# Patient Record
Sex: Female | Born: 1985 | Race: White | Hispanic: No | Marital: Single | Smoking: Current every day smoker
Health system: Southern US, Community
[De-identification: ages and names within clinical notes are randomized; demographics above are authoritative.]

## PROBLEM LIST (undated history)

## (undated) DIAGNOSIS — O039 Complete or unspecified spontaneous abortion without complication: Secondary | ICD-10-CM

## (undated) DIAGNOSIS — C801 Malignant (primary) neoplasm, unspecified: Secondary | ICD-10-CM

## (undated) DIAGNOSIS — M419 Scoliosis, unspecified: Secondary | ICD-10-CM

## (undated) HISTORY — PX: TONSILLECTOMY: SUR1361

## (undated) HISTORY — PX: DILATION AND CURETTAGE OF UTERUS: SHX78

---

## 2005-08-12 ENCOUNTER — Emergency Department: Payer: Self-pay | Admitting: Emergency Medicine

## 2006-08-05 ENCOUNTER — Emergency Department: Payer: Self-pay | Admitting: Emergency Medicine

## 2006-08-19 ENCOUNTER — Observation Stay: Payer: Self-pay | Admitting: Vascular Surgery

## 2006-10-21 ENCOUNTER — Emergency Department: Payer: Self-pay | Admitting: Unknown Physician Specialty

## 2007-11-04 ENCOUNTER — Other Ambulatory Visit: Payer: Self-pay

## 2007-11-04 ENCOUNTER — Emergency Department: Payer: Self-pay | Admitting: Emergency Medicine

## 2008-04-24 ENCOUNTER — Emergency Department: Payer: Self-pay | Admitting: Emergency Medicine

## 2008-05-10 ENCOUNTER — Emergency Department: Payer: Self-pay | Admitting: Emergency Medicine

## 2009-07-22 ENCOUNTER — Emergency Department: Payer: Self-pay | Admitting: Unknown Physician Specialty

## 2009-12-09 ENCOUNTER — Emergency Department: Payer: Self-pay | Admitting: Emergency Medicine

## 2010-02-18 ENCOUNTER — Emergency Department: Payer: Self-pay

## 2010-08-15 ENCOUNTER — Observation Stay: Payer: Self-pay

## 2010-09-05 ENCOUNTER — Inpatient Hospital Stay: Payer: Self-pay | Admitting: Psychiatry

## 2010-09-11 ENCOUNTER — Observation Stay: Payer: Self-pay | Admitting: Obstetrics and Gynecology

## 2010-09-11 ENCOUNTER — Inpatient Hospital Stay: Payer: Self-pay | Admitting: Psychiatry

## 2010-10-11 ENCOUNTER — Inpatient Hospital Stay: Payer: Self-pay | Admitting: Obstetrics and Gynecology

## 2010-10-29 ENCOUNTER — Observation Stay: Payer: Self-pay | Admitting: Obstetrics & Gynecology

## 2010-11-06 ENCOUNTER — Observation Stay: Payer: Self-pay | Admitting: Obstetrics and Gynecology

## 2010-11-08 ENCOUNTER — Encounter: Payer: Self-pay | Admitting: Obstetrics and Gynecology

## 2010-11-30 ENCOUNTER — Observation Stay: Payer: Self-pay | Admitting: Obstetrics & Gynecology

## 2010-12-06 ENCOUNTER — Encounter: Payer: Self-pay | Admitting: Obstetrics and Gynecology

## 2010-12-06 ENCOUNTER — Observation Stay: Payer: Self-pay | Admitting: Obstetrics and Gynecology

## 2010-12-07 ENCOUNTER — Observation Stay: Payer: Self-pay | Admitting: Obstetrics and Gynecology

## 2010-12-15 ENCOUNTER — Observation Stay: Payer: Self-pay | Admitting: Obstetrics and Gynecology

## 2010-12-20 ENCOUNTER — Encounter: Payer: Self-pay | Admitting: Obstetrics and Gynecology

## 2010-12-26 ENCOUNTER — Observation Stay: Payer: Self-pay | Admitting: Advanced Practice Midwife

## 2010-12-29 ENCOUNTER — Observation Stay: Payer: Self-pay

## 2011-01-07 ENCOUNTER — Observation Stay: Payer: Self-pay

## 2011-01-13 ENCOUNTER — Observation Stay: Payer: Self-pay | Admitting: Emergency Medicine

## 2011-01-15 ENCOUNTER — Inpatient Hospital Stay: Payer: Self-pay | Admitting: Obstetrics and Gynecology

## 2011-02-16 IMAGING — US US OB FOLLOW-UP
1 series · 17 of 28 positions shown · non-contrast
Comparison: none

REASON FOR EXAM: Polysubstance abuse, US needed for growth, and AFI
COMMENTS:

[Series 1: us ob follow-up · 17 of 40 slices shown]
[im 1/40]
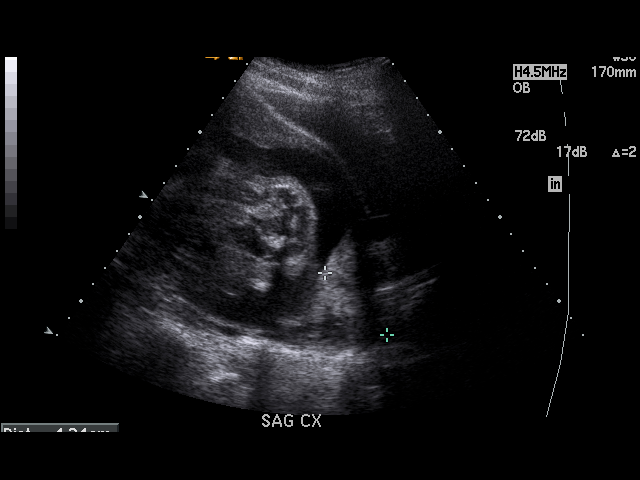
[im 3/40]
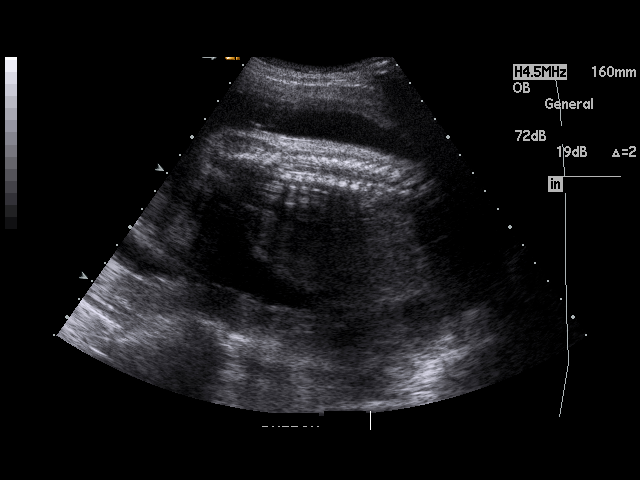
[im 6/40]
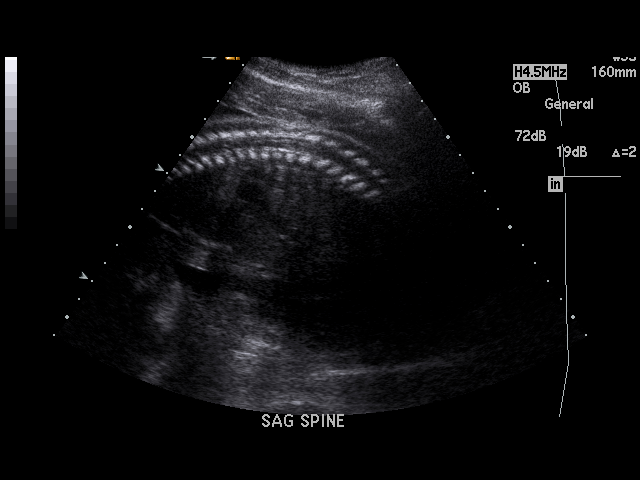
[im 8/40]
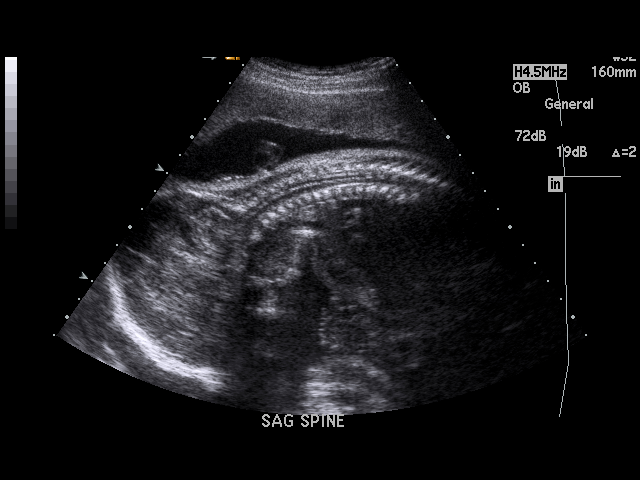
[im 11/40]
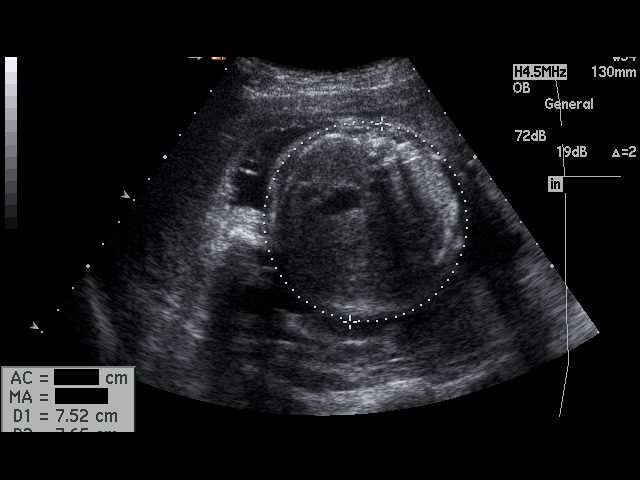
[im 14/40]
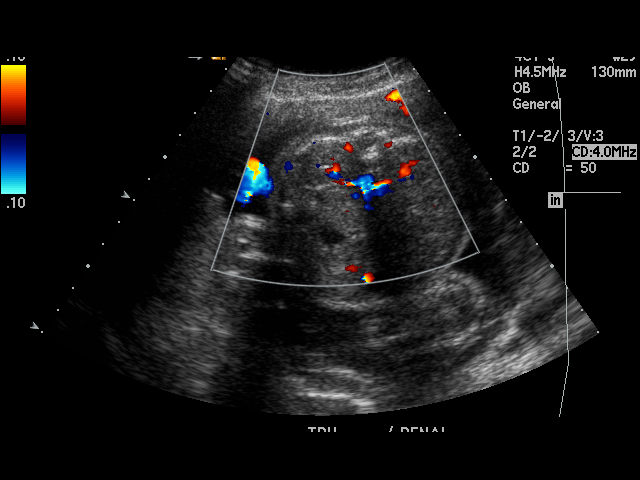
[im 15/40]
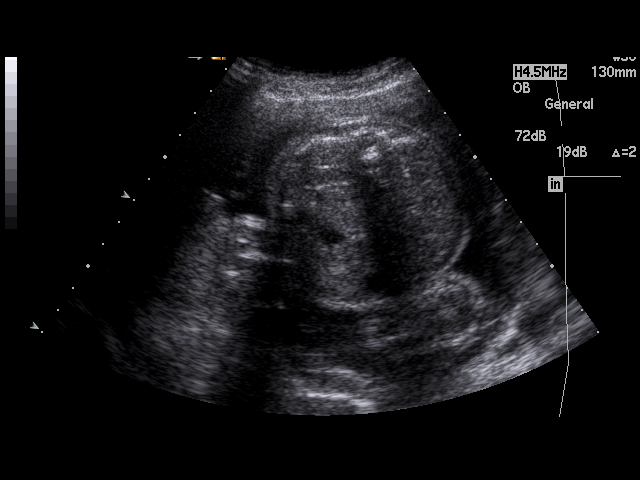
[im 18/40]
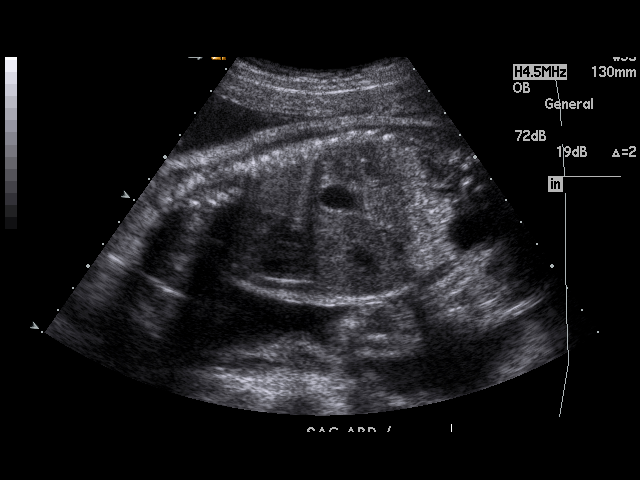
[im 21/40]
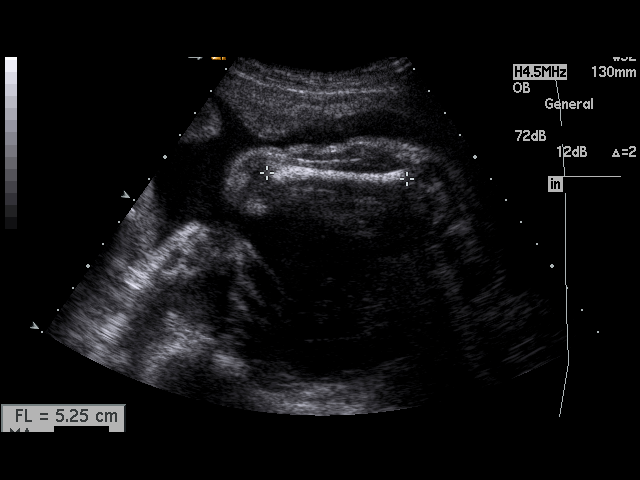
[im 22/40]
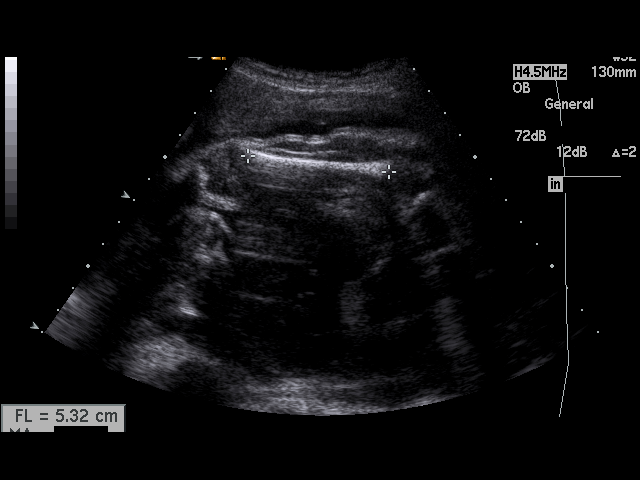
[im 25/40]
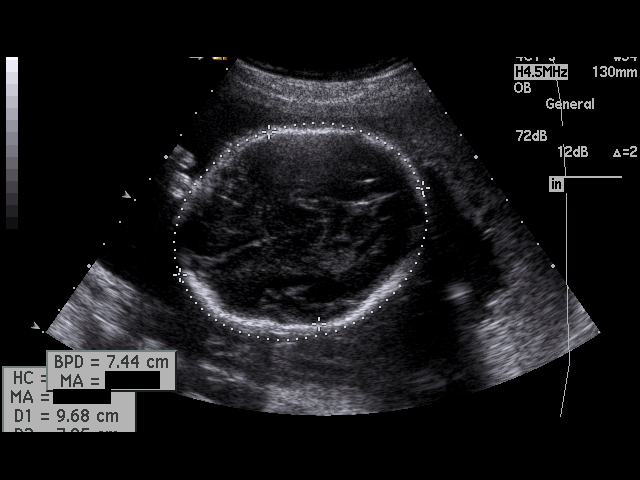
[im 27/40]
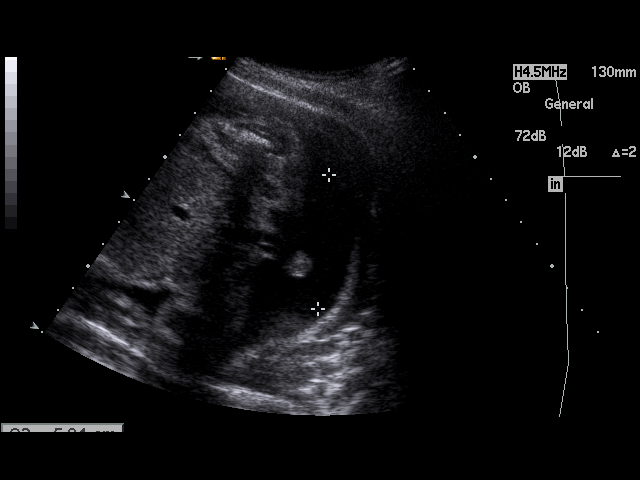
[im 29/40]
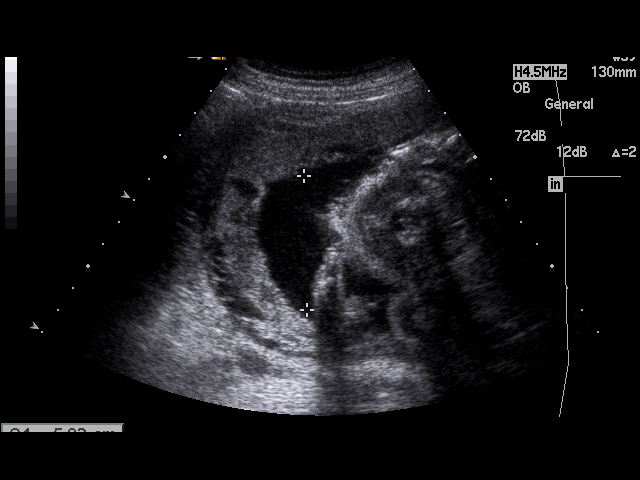
[im 32/40]
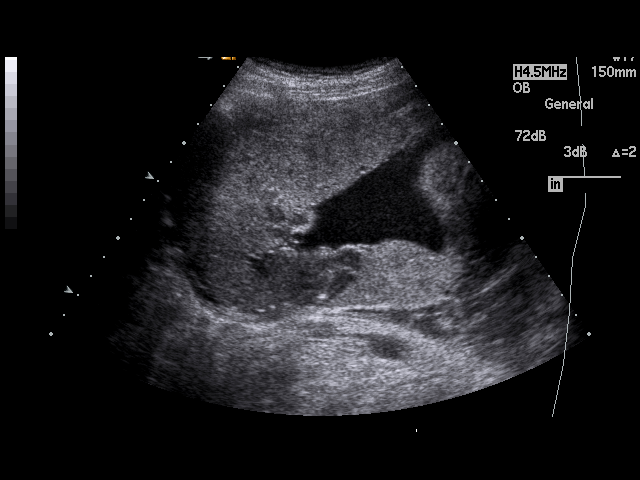
[im 34/40]
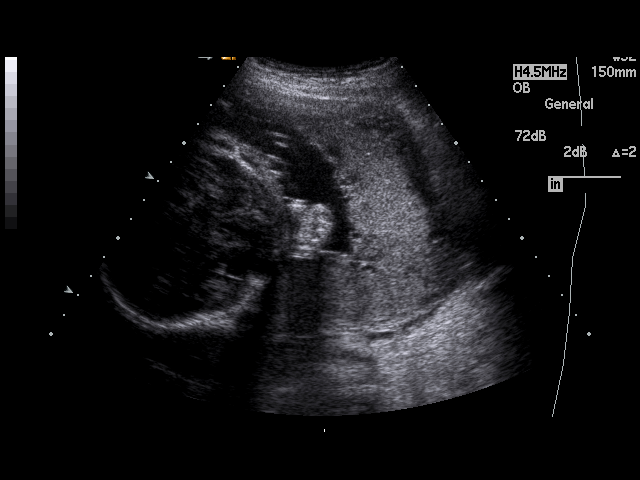
[im 37/40]
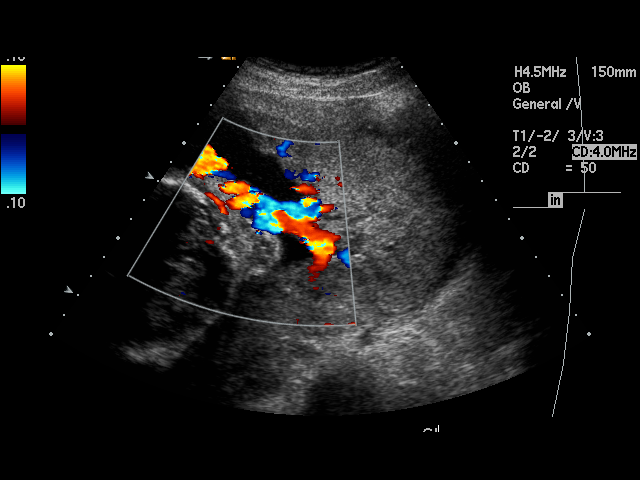
[im 40/40]
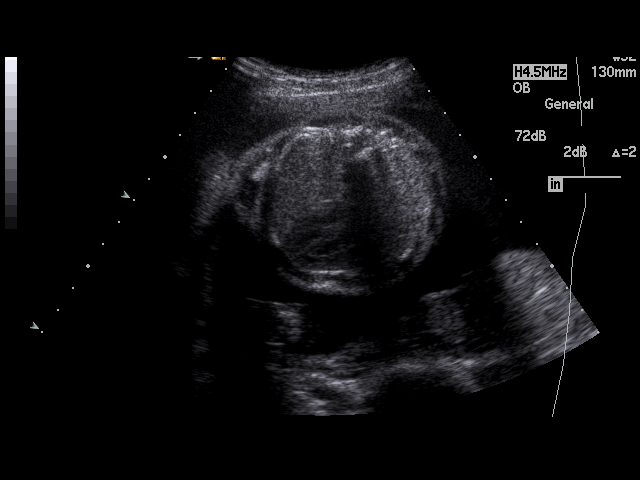

[17 of 28 positions shown; findings below may reference images not displayed]

PROCEDURE:     US  - US OB FOLLOW UP  - October 12, 2010  [DATE]

RESULT:     OB protocol pelvic sonogram demonstrates a single intrauterine
fetus. The cervical length is 4.24 cm. A breech presentation is
demonstrated. Fetal cardiac activity is demonstrated with a heart rate of
147 beats per minute. Extremity, trunk and diaphragmatic fetal motion was
demonstrated during the scan. The amniotic fluid volume is increased with a
measurement of 20.08 cm which is between the 50th and 95th percentiles. The
fetal bladder, kidneys, stomach, diaphragm and spine appear within normal
limits. The placenta is fundal and grade 2 without evidence of placenta
previa. Evaluation of the fetal heart and ventricles was performed on
previous examinations. The subcutaneous tissues appear to be somewhat
edematous on some of the images transversely through the abdomen. This
raises suspicion for possible mild edema. The gestational age based on the
fetal biometric profile is 29 weeks 0 days with an ultrasound estimate of
EDD of 12/28/2010. Current estimated fetal weight is 8414 grams + / - 165 grams.
IMPRESSION: Amniotic fluid volume is somewhat high. There may be some
fetal abdominal wall edema. High-Risk OB assessment is recommended. This was
recommended after the previous examination area

## 2011-04-03 ENCOUNTER — Emergency Department: Payer: Self-pay | Admitting: Unknown Physician Specialty

## 2011-04-27 ENCOUNTER — Emergency Department: Payer: Self-pay | Admitting: Emergency Medicine

## 2011-05-11 ENCOUNTER — Emergency Department: Payer: Self-pay | Admitting: Emergency Medicine

## 2011-12-08 ENCOUNTER — Emergency Department: Payer: Self-pay | Admitting: Internal Medicine

## 2012-01-16 ENCOUNTER — Emergency Department: Payer: Self-pay | Admitting: Emergency Medicine

## 2012-01-16 LAB — URINALYSIS, COMPLETE
Bilirubin,UR: NEGATIVE
Blood: NEGATIVE
Glucose,UR: NEGATIVE mg/dL (ref 0–75)
Leukocyte Esterase: NEGATIVE
Nitrite: NEGATIVE
Protein: NEGATIVE
RBC,UR: 1 /HPF (ref 0–5)
Squamous Epithelial: 4

## 2012-01-16 LAB — PREGNANCY, URINE: Pregnancy Test, Urine: NEGATIVE m[IU]/mL

## 2012-03-21 ENCOUNTER — Emergency Department: Payer: Self-pay | Admitting: *Deleted

## 2012-03-21 LAB — PREGNANCY, URINE: Pregnancy Test, Urine: POSITIVE m[IU]/mL

## 2012-03-21 LAB — URINALYSIS, COMPLETE
Bacteria: NONE SEEN
Bilirubin,UR: NEGATIVE
Glucose,UR: NEGATIVE mg/dL (ref 0–75)
Ketone: NEGATIVE
Nitrite: NEGATIVE
Ph: 7 (ref 4.5–8.0)
RBC,UR: <1 /HPF (ref 0–5)
Specific Gravity: 1.014 (ref 1.003–1.030)
WBC UR: 3 /HPF (ref 0–5)

## 2012-03-21 LAB — CBC
HCT: 39.2 % (ref 35.0–47.0)
HGB: 13.1 g/dL (ref 12.0–16.0)
MCH: 28.4 pg (ref 26.0–34.0)
MCHC: 33.3 g/dL (ref 32.0–36.0)
Platelet: 234 10*3/uL (ref 150–440)
RBC: 4.6 10*6/uL (ref 3.80–5.20)

## 2012-03-22 LAB — LIPASE, BLOOD: Lipase: 219 U/L (ref 73–393)

## 2012-03-22 LAB — HCG, QUANTITATIVE, PREGNANCY: Beta Hcg, Quant.: 45995 m[IU]/mL — ABNORMAL HIGH

## 2012-03-22 LAB — DIFFERENTIAL
Eosinophil #: 0.2 10*3/uL (ref 0.0–0.7)
Monocyte #: 1.3 x10 3/mm — ABNORMAL HIGH (ref 0.2–0.9)
Neutrophil #: 16.2 10*3/uL — ABNORMAL HIGH (ref 1.4–6.5)

## 2012-06-04 ENCOUNTER — Emergency Department: Payer: Self-pay | Admitting: Emergency Medicine

## 2012-07-20 ENCOUNTER — Emergency Department: Payer: Self-pay | Admitting: Emergency Medicine

## 2012-07-20 LAB — ETHANOL
Ethanol %: <0.003 % (ref 0.000–0.080)
Ethanol: <3 mg/dL

## 2012-07-20 LAB — URINALYSIS, COMPLETE
Bacteria: NONE SEEN
Bilirubin,UR: NEGATIVE
Glucose,UR: NEGATIVE mg/dL (ref 0–75)
Protein: NEGATIVE
RBC,UR: 1 /HPF (ref 0–5)
Specific Gravity: 1.019 (ref 1.003–1.030)
Squamous Epithelial: 2
WBC UR: 4 /HPF (ref 0–5)

## 2012-07-20 LAB — DRUG SCREEN, URINE
Amphetamines, Ur Screen: NEGATIVE (ref ?–1000)
Benzodiazepine, Ur Scrn: NEGATIVE (ref ?–200)
Cannabinoid 50 Ng, Ur ~~LOC~~: POSITIVE (ref ?–50)
Cocaine Metabolite,Ur ~~LOC~~: POSITIVE (ref ?–300)
MDMA (Ecstasy)Ur Screen: NEGATIVE (ref ?–500)
Opiate, Ur Screen: NEGATIVE (ref ?–300)

## 2012-07-20 LAB — COMPREHENSIVE METABOLIC PANEL
Albumin: 3 g/dL — ABNORMAL LOW (ref 3.4–5.0)
Alkaline Phosphatase: 67 U/L (ref 50–136)
BUN: 6 mg/dL — ABNORMAL LOW (ref 7–18)
Bilirubin,Total: 0.2 mg/dL (ref 0.2–1.0)
Co2: 26 mmol/L (ref 21–32)
Creatinine: 0.67 mg/dL (ref 0.60–1.30)
EGFR (Non-African Amer.): >60
Glucose: 90 mg/dL (ref 65–99)
Osmolality: 265 (ref 275–301)
Potassium: 3.6 mmol/L (ref 3.5–5.1)
SGOT(AST): 16 U/L (ref 15–37)
SGPT (ALT): 9 U/L — ABNORMAL LOW (ref 12–78)
Sodium: 134 mmol/L — ABNORMAL LOW (ref 136–145)
Total Protein: 7.1 g/dL (ref 6.4–8.2)

## 2012-07-20 LAB — ACETAMINOPHEN LEVEL: Acetaminophen: <2 ug/mL

## 2012-07-20 LAB — CBC
HCT: 36.8 % (ref 35.0–47.0)
HGB: 12.5 g/dL (ref 12.0–16.0)
MCH: 30.1 pg (ref 26.0–34.0)
MCHC: 34 g/dL (ref 32.0–36.0)
Platelet: 270 10*3/uL (ref 150–440)
RDW: 15.9 % — ABNORMAL HIGH (ref 11.5–14.5)

## 2013-06-23 ENCOUNTER — Inpatient Hospital Stay: Payer: Self-pay | Admitting: Psychiatry

## 2013-06-23 LAB — URINALYSIS, COMPLETE
Bilirubin,UR: NEGATIVE
Ketone: NEGATIVE
Nitrite: NEGATIVE
RBC,UR: 15 /HPF (ref 0–5)
Specific Gravity: 1.025 (ref 1.003–1.030)
WBC UR: 93 /HPF (ref 0–5)

## 2013-06-23 LAB — COMPREHENSIVE METABOLIC PANEL
Anion Gap: 6 — ABNORMAL LOW (ref 7–16)
BUN: 8 mg/dL (ref 7–18)
Calcium, Total: 9 mg/dL (ref 8.5–10.1)
EGFR (African American): >60
Sodium: 137 mmol/L (ref 136–145)

## 2013-06-23 LAB — CBC
HCT: 47.9 % — ABNORMAL HIGH (ref 35.0–47.0)
HGB: 15.9 g/dL (ref 12.0–16.0)
Platelet: 286 10*3/uL (ref 150–440)
RBC: 5.6 10*6/uL — ABNORMAL HIGH (ref 3.80–5.20)
RDW: 13.2 % (ref 11.5–14.5)
WBC: 4.9 10*3/uL (ref 3.6–11.0)

## 2013-06-23 LAB — DRUG SCREEN, URINE
Amphetamines, Ur Screen: NEGATIVE (ref ?–1000)
Barbiturates, Ur Screen: NEGATIVE (ref ?–200)
Cocaine Metabolite,Ur ~~LOC~~: POSITIVE (ref ?–300)
MDMA (Ecstasy)Ur Screen: NEGATIVE (ref ?–500)
Methadone, Ur Screen: NEGATIVE (ref ?–300)
Opiate, Ur Screen: NEGATIVE (ref ?–300)

## 2013-06-23 LAB — ETHANOL
Ethanol %: <0.003 % (ref 0.000–0.080)
Ethanol: <3 mg/dL

## 2013-07-16 ENCOUNTER — Emergency Department: Payer: Self-pay | Admitting: Emergency Medicine

## 2013-07-16 LAB — DRUG SCREEN, URINE
Amphetamines, Ur Screen: NEGATIVE (ref ?–1000)
Barbiturates, Ur Screen: NEGATIVE (ref ?–200)
Benzodiazepine, Ur Scrn: NEGATIVE (ref ?–200)
Cocaine Metabolite,Ur ~~LOC~~: POSITIVE (ref ?–300)
MDMA (Ecstasy)Ur Screen: POSITIVE (ref ?–500)
Opiate, Ur Screen: NEGATIVE (ref ?–300)
Phencyclidine (PCP) Ur S: NEGATIVE (ref ?–25)
Tricyclic, Ur Screen: NEGATIVE (ref ?–1000)

## 2013-07-16 LAB — ETHANOL: Ethanol %: <0.003 % (ref 0.000–0.080)

## 2013-07-16 LAB — PREGNANCY, URINE: Pregnancy Test, Urine: NEGATIVE m[IU]/mL

## 2015-03-17 NOTE — Consult Note (Signed)
PATIENT NAME:  Anna Ferguson, Anna Ferguson MR#:  [id number] DATE OF BIRTH:  05/14/1986  DATE OF CONSULTATION:  06/23/2013  REFERRING PHYSICIAN:   CONSULTING PHYSICIAN:  Idalia Needle. Chauncy Passy, MD  CHIEF COMPLAINT: "I'm going to have to have a detox plus I haven't been taking my medicines, so I need to be placed back on my medicines." When I asked her what she was taking, she says that she does not remember.    PSYCHIATRIC HISTORY: [name] reports that she has been in jail for 60 days for a probation violation and was discharged from jail on 04/22/2013. Since then, she has been using "Suboxone, crack and weed."   She also reports that she has a long history of depression and bipolar disorder. She has been on medicine for that and wants some medicines now.   She reports that she has been having poor appetite with weight loss, poor concentration, tearfulness, anhedonia and thoughts of suicide. She is a very tearful in the interview.   She reports losing custody of her daughter and not having any social support as 2 of her big problems. She has financial stress, legal issues and a history of abuse also. She denies any history of suicide attempts. "I just don't know how I'd do it, probably take an overdose."   She also denies homicidality. She reports she has no history of violence or incarceration for such. She does not wish to harm anyone.   ALCOHOL AND DRUG USE:  1. Cocaine. [name] reports that she first used cocaine at the age of 50, and she last used on 06/20/2013. She reports she uses 7 days a week and usually uses about 2 grams.  2. She also uses marijuana. She first used marijuana at the age of 77, and her last use of that was also 06/20/2013. She reports she uses 7 days a week and uses about 2 ounces a day.  3. She also uses Suboxone, and her age of first use with that is 27. She last used that also on 06/20/2013. She reports that she uses 2 strips IV 7 days a week.    MENTAL STATUS EXAM: [name] is disheveled and underweight. She is sad-appearing and has poor eye contact. She stares and is slow to react. She is unable to follow all questioning. She reports that she has been having difficulty falling and staying asleep with early morning awakening. She also has recent mood changes and anhedonia. She is dysphoric and depressed and anxious. She is sad and tearful on interview. She feels bad about her daughter and very guilty and hopeless. Her concentration is poor, and she tends perseverate. Her memory is intact. She denies any auditory or visual hallucinations or delusions. There is no psychosis. Her psychomotor behavior is within normal limits. She reports she has no energy. She is oriented x4. She is soft spoken on interview with minimal insight and poor judgment.   SOCIAL HISTORY: She reports that she is homeless and that she does not have an adequate personal support system. She reports that her 21-month-old daughter is living with her sister. Her family is not involved in her care.   MEDICATIONS: Omeprazole 20 mg oral delayed-release capsule p.o. daily. She reports she is taking no medicines and that she stopped them a long time ago.   ALLERGIES: No known drug allergies.   DIAGNOSIS, PRINCIPAL AND PRIMARY:  AXIS I: Polysubstance dependence. Bipolar disorder, not otherwise specified.  AXIS II: None. AXIS  III: Gastroesophageal reflux disease. AXIS IV: Moderate to severe.  AXIS V: 40%.   PLAN: [name] will be admitted to the [facility] when medically cleared.    ____________________________ Idalia Needle. Chauncy Passy, MD fcg:OSi D: 06/23/2013 12:50:00 ET T: 06/23/2013 13:06:49 ET JOB#: [id number]  cc: Idalia Needle. Chauncy Passy, MD, <Dictator> Ladoris Gene MD ELECTRONICALLY SIGNED 06/23/2013 13:50

## 2015-03-17 NOTE — Consult Note (Signed)
Brief Consult Note: Diagnosis: cocaine and alcohol dep.   Patient was seen by consultant.   Comments: Psychiatry: PAtient with hx of substance dependence presented to ER with somatic complaints. Affect calm. Denies any suicidal or homicidal ideation and is commited to remaining sober. Insight good. Has been accepted to [facility] which is a good disposition and exactly what she wants. Supportive and educational counceling done. No need for other emergency intervention. Suggest patient be released from ER to residential treatment.  Electronic Signatures: Gonzella Lex (MD)  (Signed 22-Aug-14 17:26)  Authored: Brief Consult Note   Last Updated: 22-Aug-14 17:26 by Gonzella Lex (MD)

## 2015-03-17 NOTE — H&P (Signed)
PATIENT NAME:  Anna Ferguson, Anna Ferguson MR#:  [id number] DATE OF BIRTH:  [birth year]  DATE OF ADMISSION:  06/23/2013  DATE OF BIRTH: 1986-04-26.   DATE OF ASSESSMENT: 06/24/2013.  REFERRING PHYSICIAN:  Emergency Room MD.  ATTENDING PHYSICIAN: Christiano Blandon B. Bary Leriche, M.D.   IDENTIFYING DATA: Ms Courtney Paris is a 29 year old female with history of mood instability and substance abuse.   CHIEF COMPLAINT: "I need detox."  HISTORY OF PRESENT ILLNESS: Ms Courtney Paris was just released from jail. She relapsed on substances immediately. She came to the hospital asking for detox.   She endorses some symptoms of depression with poor sleep, decreased appetite, anhedonia, feelings of guilt, worthlessness, hopelessness, poor energy and concentration, social isolation, crying spells. She feels awful about her relapse as she is already in the process of [facility] residential program. She now and needs to be detoxed from substances before she can in the [facility], and the [facility] is meant for women with substance abuse problems who also are trying to get out of legal trouble or regain custody of their children.   Indeed, the patient has an 82-month-old daughter who is in the custody of her sister. The patient denies psychotic symptoms. She denies symptoms suggestive of bipolar mania, but does endorse high anxiety and mood instability. She denies heavy alcohol use, but has been using cocaine, marijuana, and Suboxone. She uses 2 films of Suboxone IV daily.   PAST PSYCHIATRIC HISTORY: She has been hospitalized since adolescence at least 3 three hospitalizations then, at [facility] and at [facility]. She has been tried on several medications including antidepressants, which she does not believe work for her, Zyprexa and Haldol. She remembers fondly Klonopin that helps with her anxiety. There is a suspicion of mental retardation, but the patient has  never been formally tested.   FAMILY PSYCHIATRIC HISTORY: Mother and father with anxiety, depression, and also an aunt with the same problems.   PAST MEDICAL HISTORY: History of pneumothorax.   ALLERGIES: No known drug allergies.   MEDICATIONS ON ADMISSION: None.   SOCIAL HISTORY: She was just released from jail. She has 2 children. One was given up for adoption. Her 58-month-old daughter is in the care of a sister. The patient is unemployed. She has no income, no health benefits. And she dropped out of school in the 10th grade, most likely due to learning difficulties. She has been to jail numerous times. Apparently there are no legal charges pending at the moment.   REVIEW OF SYSTEMS:  CONSTITUTIONAL: No fevers or chills. No weight changes.  EYES: No double or blurred vision.  ENT: No hearing loss.  RESPIRATORY: No shortness of breath or cough.  CARDIOVASCULAR: No chest pain or orthopnea.  GASTROINTESTINAL: No abdominal pain, nausea, vomiting, or diarrhea.  GENITOURINARY: No incontinence or frequency.  ENDOCRINE: No heat or cold intolerance.  LYMPHATIC: No anemia or easy bruising.  INTEGUMENTARY: No acne or rash.  MUSCULOSKELETAL: No muscle or joint pain.  NEUROLOGIC: No tingling or weakness.  PSYCHIATRIC: See history of present illness for details.   PHYSICAL EXAMINATION: VITAL SIGNS: Blood pressure 101/70, pulse 56, respirations 18, temperature 97.3.  GENERAL: This is a slender female in no acute distress.  HEENT: The pupils are equal, round, and reactive to light. Sclerae are anicteric.  NECK: Supple. No thyromegaly.   LUNGS: Clear to auscultation. No dullness to percussion.  HEART: Regular rhythm and rate. No murmurs, rubs, or gallops.  ABDOMEN: Soft, nontender, nondistended. Positive bowel  sounds.  MUSCULOSKELETAL: Normal muscle strength in all extremities.  SKIN: No rashes or bruises.  LYMPHATIC: No cervical adenopathy.  NEUROLOGIC: Cranial nerves II-XII are intact.    LABORATORY DATA: Chemistries are within normal limits. Blood alcohol level is 0. LFTs within normal limits. TSH 2.88.   Urine tox screen positive for cocaine and cannabinoids.   CBC with normal limits. Urinalysis is suggestive of urinary tract infection.   MENTAL STATUS EXAMINATION ON ADMISSION: The patient is asleep, but easily arousable. She is oriented to person, place, time, and situation. She is pleasant, polite, and cooperative. She is marginally groomed. She is wearing hospital scrubs. She maintains some eye contact. Her speech is soft. Mood is depressed, with flat affect. Thought processing is logical and goal-oriented. Thought content: She denies suicidal or homicidal ideations. There are no delusions or paranoia. There are no auditory or visual hallucinations. Her cognition is grossly intact. She registers 3 out of 3 and recalls 3 out of 3 objects after 5 minutes. She can spell "world" forwards and backwards. Her thinking is concrete, and she is not able to interpret proverbs. Her insight and judgment are very limited.   Suicide risk assessment on admission: This is a patient with a history of substance abuse and mood instability, who came to the hospital asking for detox.   DIAGNOSES: AXIS I:  1.  Polysubstance dependence.  2.  Mood disorder, not otherwise specified.  3.  History of diagnosis of bipolar disorder.   AXIS II: Cognitive disorder, not otherwise specified.   AXIS III: Deferred.   AXIS IV: Mental illness, substance abuse, housing, financial, primary support, family conflict.   AXIS V: Global Assessment of Functioning on admission: 35.   PLAN: The patient was admitted to [facility] behavioral medicine unit for safety, stabilization and medication management. She was initially placed on suicide precautions and was closely monitored for any unsafe behaviors. She underwent full psychiatric and risk assessment. She received pharmacotherapy,  individual and group psychotherapy, substance abuse counseling, and support from therapeutic milieu.   1.  Substance abuse: The patient is placed on opioid detox protocol with symptomatic treatment.  2.  Social:  We will get in touch with [facility] to schedule a followup.  3.  Disposition: To be established. The patient has no place to go, and it is unclear if a bed will be open at [facility].    ____________________________ Wardell Honour Bary Leriche, MD jbp:dm D: 06/24/2013 13:48:32 ET T: 06/24/2013 14:23:01 ET JOB#: [id number]  cc: Traves Majchrzak B. Bary Leriche, MD, <Dictator> Clovis Fredrickson MD ELECTRONICALLY SIGNED 07/02/2013 6:36

## 2017-11-30 ENCOUNTER — Emergency Department: Payer: Medicaid Other

## 2017-11-30 ENCOUNTER — Encounter: Payer: Self-pay | Admitting: *Deleted

## 2017-11-30 ENCOUNTER — Emergency Department
Admission: EM | Admit: 2017-11-30 | Discharge: 2017-11-30 | Disposition: A | Discharge status: Home / Self Care | Payer: Medicaid Other | Attending: Emergency Medicine | Admitting: Emergency Medicine

## 2017-11-30 ENCOUNTER — Other Ambulatory Visit: Payer: Self-pay

## 2017-11-30 DIAGNOSIS — O4690 Antepartum hemorrhage, unspecified, unspecified trimester: Secondary | ICD-10-CM | POA: Diagnosis not present

## 2017-11-30 DIAGNOSIS — M545 Low back pain, unspecified: Secondary | ICD-10-CM

## 2017-11-30 DIAGNOSIS — Z8759 Personal history of other complications of pregnancy, childbirth and the puerperium: Secondary | ICD-10-CM | POA: Diagnosis not present

## 2017-11-30 DIAGNOSIS — Z3A12 12 weeks gestation of pregnancy: Secondary | ICD-10-CM | POA: Insufficient documentation

## 2017-11-30 DIAGNOSIS — F17200 Nicotine dependence, unspecified, uncomplicated: Secondary | ICD-10-CM | POA: Diagnosis not present

## 2017-11-30 DIAGNOSIS — O200 Threatened abortion: Secondary | ICD-10-CM

## 2017-11-30 DIAGNOSIS — N939 Abnormal uterine and vaginal bleeding, unspecified: Secondary | ICD-10-CM

## 2017-11-30 HISTORY — DX: Complete or unspecified spontaneous abortion without complication: O03.9

## 2017-11-30 LAB — CBC WITH DIFFERENTIAL/PLATELET
Basophils Absolute: 0.1 10*3/uL (ref 0–0.1)
Basophils Relative: 1 %
EOS ABS: 0.1 10*3/uL (ref 0–0.7)
EOS PCT: 2 %
HCT: 41 % (ref 35.0–47.0)
Hemoglobin: 13.4 g/dL (ref 12.0–16.0)
LYMPHS ABS: 1.4 10*3/uL (ref 1.0–3.6)
LYMPHS PCT: 24 %
MCH: 28.7 pg (ref 26.0–34.0)
MCHC: 32.8 g/dL (ref 32.0–36.0)
MCV: 87.6 fL (ref 80.0–100.0)
MONO ABS: 0.4 10*3/uL (ref 0.2–0.9)
Monocytes Relative: 8 %
Neutro Abs: 3.7 10*3/uL (ref 1.4–6.5)
Neutrophils Relative %: 65 %
PLATELETS: 242 10*3/uL (ref 150–440)
RBC: 4.68 MIL/uL (ref 3.80–5.20)
RDW: 15.1 % — AB (ref 11.5–14.5)
WBC: 5.7 10*3/uL (ref 3.6–11.0)

## 2017-11-30 LAB — BASIC METABOLIC PANEL
Anion gap: 11 (ref 5–15)
BUN: 5 mg/dL — AB (ref 6–20)
CALCIUM: 9.3 mg/dL (ref 8.9–10.3)
CO2: 21 mmol/L — ABNORMAL LOW (ref 22–32)
CREATININE: 0.64 mg/dL (ref 0.44–1.00)
Chloride: 105 mmol/L (ref 101–111)
GFR calc Af Amer: >60 mL/min (ref >60)
GLUCOSE: 88 mg/dL (ref 65–99)
POTASSIUM: 4 mmol/L (ref 3.5–5.1)
SODIUM: 137 mmol/L (ref 135–145)

## 2017-11-30 LAB — PREGNANCY, URINE: PREG TEST UR: POSITIVE — AB

## 2017-11-30 LAB — HCG, QUANTITATIVE, PREGNANCY: hCG, Beta Chain, Quant, S: 14833 m[IU]/mL — ABNORMAL HIGH (ref <5)

## 2017-11-30 LAB — ABO/RH: ABO/RH(D): A POS

## 2017-11-30 NOTE — ED Provider Notes (Signed)
[facility] Emergency Department Provider Note   ____________________________________________    I have reviewed the triage vital signs and the nursing notes.   HISTORY  Chief Complaint Vaginal Bleeding     HPI Anna Ferguson is a 32 y.o. female who reports she is approximately [redacted] weeks pregnant who presents with complaints of vaginal bleeding.  Patient started off as spotting but has worsened overnight.  She denies abdominal pain.  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  Denies fevers or chills.  N she does have some cramping bilateral lower back pain.  No nausea or vomiting.  Has not taken anything for this   Past Medical History:  Diagnosis Date  . Miscarriage     There are no active problems to display for this patient.   Past Surgical History:  Procedure Laterality Date  . DILATION AND CURETTAGE OF UTERUS      Prior to Admission medications   Not on File     Allergies Gabapentin  No family history on file.  Social History Social History   Tobacco Use  . Smoking status: Current Every Day Smoker  . Smokeless tobacco: Never Used  Substance Use Topics  . Alcohol use: No    Frequency: Never  . Drug use: No    Review of Systems  Constitutional: No fever/chills Eyes: No visual changes.  ENT: No sore throat. Cardiovascular: Denies chest pain. Respiratory: Denies shortness of breath. Gastrointestinal: No abdominal pain.  No nausea, no vomiting.   Genitourinary: As above Musculoskeletal: As above Skin: Negative for rash. Neurological: Negative for headaches    ____________________________________________   PHYSICAL EXAM:  VITAL SIGNS: ED Triage Vitals  Enc Vitals Group     BP 11/30/17 1331 (!) 110/58     Pulse Rate 11/30/17 1331 87     Resp 11/30/17 1331 15     Temp 11/30/17 1331 97.8 F (36.6 C)     Temp Source 11/30/17 1331 Oral     SpO2 11/30/17 1331 100 %     Weight 11/30/17 1332 48.5 kg  (107 lb)     Height 11/30/17 1332 1.727 m (5\' 8" )     Head Circumference --      Peak Flow --      Pain Score 11/30/17 1344 6     Pain Loc --      Pain Edu? --      Excl. in [place]? --     Constitutional: Alert and oriented. No acute distress. . Nose: No congestion/rhinnorhea. Mouth/Throat: Mucous membranes are moist.   Neck:  Painless ROM Cardiovascular: Normal rate, regular rhythm. Kermit Balo peripheral circulation. Respiratory: Normal respiratory effort.  No retractions. Gastrointestinal: Soft and nontender. No distention.  No CVA tenderness. Genitourinary: deferred Musculoskeletal: No lower extremity tenderness nor edema.  Warm and well perfused.  Back: Normal exam, no vertebral tenderness, no muscle tenderness bruising or injury Neurologic:  Normal speech and language. No gross focal neurologic deficits are appreciated.  Skin:  Skin is warm, dry and intact. No rash noted. Psychiatric: Mood and affect are normal. Speech and behavior are normal.  ____________________________________________   LABS (all labs ordered are listed, but only abnormal results are displayed)  Labs Reviewed  HCG, QUANTITATIVE, PREGNANCY - Abnormal; Notable for the following components:      Result Value   hCG, Beta Chain, Quant, S 14,833 (*)    All other components within normal limits  CBC WITH DIFFERENTIAL/PLATELET -  Abnormal; Notable for the following components:   RDW 15.1 (*)    All other components within normal limits  BASIC METABOLIC PANEL - Abnormal; Notable for the following components:   CO2 21 (*)    BUN 5 (*)    All other components within normal limits  PREGNANCY, URINE - Abnormal; Notable for the following components:   Preg Test, Ur POSITIVE (*)    All other components within normal limits  ABO/RH   ____________________________________________  EKG  None ____________________________________________  RADIOLOGY  Ultrasound demonstrates 7-week  IUP ____________________________________________   PROCEDURES  Procedure(s) performed: No  Procedures   Critical Care performed:No ____________________________________________   INITIAL IMPRESSION / ASSESSMENT AND PLAN / ED COURSE  Pertinent labs & imaging results that were available during my care of the patient were reviewed by me and considered in my medical decision making (see chart for details).  Patient presents with vaginal bleeding of the first trimester.  History of miscarriage in the past.  Difficult stick, lab has managed to obtain labs.  Pending hCG, ABO Rh, ultrasound.  Vitals normal, exam reassuring.  Differential includes threatened miscarriage, ectopic pregnancy, subchorionic hemorrhage  ----------------------------------------- 3:57 PM on 11/30/2017 -----------------------------------------  Ultrasound shows 7-week IUP, a positive blood type, recommend follow-up with OB for hormone recheck    ____________________________________________   FINAL CLINICAL IMPRESSION(S) / ED DIAGNOSES  Final diagnoses:  Vaginal bleeding  Lumbar back pain        Note:  This document was prepared using Dragon voice recognition software and may include unintentional dictation errors.    Lavonia Drafts, MD 11/30/17 [phone]

## 2017-11-30 NOTE — ED Triage Notes (Addendum)
Patient states she is [redacted] weeks pregnant and started bleeding last night that began lightly and is now heavier. Patient c/o lumbar back pain. Patient has had two previous miscarriages.

## 2017-11-30 NOTE — ED Notes (Signed)
Phlebotomist at bedside.

## 2017-11-30 NOTE — ED Notes (Signed)
Phlebotomist called for lab draw due to multiple attempts in triage by sonja and dave with no success

## 2017-11-30 NOTE — ED Notes (Addendum)
Patient and significant other left at getting results from MD without d/c papers. No pain assessment, vitals and signature available to get due to patient leaving

## 2017-11-30 NOTE — ED Notes (Signed)
Patient called multiple times for CPOD room. Pt found outside smoking and taken to room

## 2017-12-03 ENCOUNTER — Ambulatory Visit (INDEPENDENT_AMBULATORY_CARE_PROVIDER_SITE_OTHER): Payer: Medicaid Other | Admitting: Obstetrics and Gynecology

## 2017-12-03 ENCOUNTER — Other Ambulatory Visit (INDEPENDENT_AMBULATORY_CARE_PROVIDER_SITE_OTHER): Payer: Medicaid Other

## 2017-12-03 ENCOUNTER — Encounter: Payer: Self-pay | Admitting: Obstetrics and Gynecology

## 2017-12-03 VITALS — BP 92/52 | HR 71 | Ht 68.0 in | Wt 111.0 lb

## 2017-12-03 DIAGNOSIS — O034 Incomplete spontaneous abortion without complication: Secondary | ICD-10-CM | POA: Diagnosis not present

## 2017-12-03 DIAGNOSIS — Z23 Encounter for immunization: Secondary | ICD-10-CM

## 2017-12-03 DIAGNOSIS — O021 Missed abortion: Secondary | ICD-10-CM | POA: Diagnosis not present

## 2017-12-03 NOTE — Progress Notes (Signed)
Pt's   Patient ID: Anna Ferguson, female   DOB: 11/07/1986, 32 y.o.   MRN: [id number]  Reason for Consult: Follow-up (ER f/u early OB bleeding)   Referred by No ref. provider found  Subjective:     HPI:  Anna Ferguson is a 32 y.o. female. She had positive urine pregnancy test at home and pregnancy was confirmed at [facility]. Pt went to ER 4 nights ago for heavy bleeding. Pain in lower back. The bleeding has continued. Similar bleeding to a period. She denies passage of tissue. Patient uses suboxone.  Past Medical History:  Diagnosis Date  . Miscarriage    Family History  Problem Relation Age of Onset  . Cancer Neg Hx   . Diabetes Neg Hx   . Stroke Neg Hx   . Heart disease Neg Hx    Past Surgical History:  Procedure Laterality Date  . DILATION AND CURETTAGE OF UTERUS      Short Social History:  Social History   Tobacco Use  . Smoking status: Current Every Day Smoker    Packs/day: 0.50    Types: Cigarettes  . Smokeless tobacco: Never Used  Substance Use Topics  . Alcohol use: No    Frequency: Never    Allergies  Allergen Reactions  . Gabapentin Other (See Comments)    seizures    Current Outpatient Medications  Medication Sig Dispense Refill  . Buprenorphine HCl-Naloxone HCl (SUBOXONE) 8-2 MG FILM Place under the tongue 2 (two) times daily.     No current facility-administered medications for this visit.     Review of Systems  Constitutional: Negative for chills, fatigue, fever and unexpected weight change.  HENT: Negative for trouble swallowing.  Eyes: Negative for loss of vision.  Respiratory: Negative for cough, shortness of breath and wheezing.  Cardiovascular: Negative for chest pain, leg swelling, palpitations and syncope.  GI: Negative for abdominal pain, blood in stool, diarrhea, nausea and vomiting.  GU: Negative for difficulty urinating, dysuria, frequency and hematuria.  Musculoskeletal: Positive for back pain. Negative for  leg pain and joint pain.  Skin: Negative for rash.  Neurological: Negative for dizziness, headaches, light-headedness, numbness and seizures.  Psychiatric: Negative for behavioral problem, confusion, depressed mood and sleep disturbance.        Objective:  Objective   Vitals:   12/03/17 1148  BP: (!) 92/52  Pulse: 71  Weight: 111 lb (50.3 kg)  Height: 5\' 8"  (1.727 m)   Body mass index is 16.88 kg/m.  Physical Exam  Constitutional: She is oriented to person, place, and time. She appears well-developed and well-nourished.  HENT:  Head: Normocephalic and atraumatic.  Eyes: EOM are normal.  Cardiovascular: Normal rate, regular rhythm and normal heart sounds.  Pulmonary/Chest: Effort normal and breath sounds normal.  Genitourinary:  Genitourinary Comments: Bright red vaginal bleeding. No products at cervical os. Os open.  Neurological: She is alert and oriented to person, place, and time.  Skin: Skin is warm and dry.  Psychiatric: She has a normal mood and affect. Her behavior is normal. Judgment and thought content normal.  Nursing note and vitals reviewed.  [place] from ER reviewed. Transvaginal US today in clinic did not show fetal heartbeat. This is consistent with a incomplete abortion     Assessment/Plan:     Incomplete abortion. Patient and partner were distressed. Offered medical or surgical management. Patient declined these options and plans for expectant management. Asked patient to follow up in 2 weeks, but she  left abruptly.  Flu vaccination performed today.  Homero Fellers MD [facility]  12/04/17 7:09 PM

## 2017-12-09 ENCOUNTER — Encounter: Payer: Self-pay | Admitting: Advanced Practice Midwife

## 2017-12-10 ENCOUNTER — Encounter: Payer: Self-pay | Admitting: Obstetrics and Gynecology

## 2017-12-11 ENCOUNTER — Other Ambulatory Visit: Payer: Self-pay | Admitting: Obstetrics and Gynecology

## 2017-12-11 ENCOUNTER — Ambulatory Visit: Payer: Medicaid Other | Admitting: Obstetrics and Gynecology

## 2017-12-11 ENCOUNTER — Ambulatory Visit (INDEPENDENT_AMBULATORY_CARE_PROVIDER_SITE_OTHER): Payer: Medicaid Other

## 2017-12-11 ENCOUNTER — Encounter: Payer: Self-pay | Admitting: Obstetrics and Gynecology

## 2017-12-11 VITALS — BP 87/56 | HR 91 | Ht 68.0 in | Wt 109.4 lb

## 2017-12-11 DIAGNOSIS — O021 Missed abortion: Secondary | ICD-10-CM

## 2017-12-11 DIAGNOSIS — O200 Threatened abortion: Secondary | ICD-10-CM

## 2017-12-11 NOTE — Progress Notes (Signed)
HPI:      [name] is a 32 y.o. [id number] who LMP was Patient's last menstrual period was 10/06/2017 (approximate).  Subjective:   She presents today after being seen in the emergency department for vaginal bleeding in pregnancy.  She also had a follow-up visit at [facility]  She underwent an ultrasound which revealed a fetal pole without heartbeat.  Patient was concerned regarding this and insists that she has felt fetal movement.  The bottom line is that the patient and her husband are not convinced of fetal demise. Patient states that she has had miscarriages before and this is not like that. Currently taking Flagyl for trichomonas. Denies bleeding today.    Hx: The following portions of the patient's history were reviewed and updated as appropriate:             She  has a past medical history of Miscarriage. She does not have a problem list on file. She  has a past surgical history that includes Dilation and curettage of uterus. Her family history is not on file. She  reports that she has been smoking cigarettes.  She has been smoking about 0.50 packs per day. she has never used smokeless tobacco. She reports that she does not drink alcohol or use drugs. She is allergic to gabapentin.       Review of Systems:  Review of Systems  Constitutional: Denied constitutional symptoms, night sweats, recent illness, fatigue, fever, insomnia and weight loss.  Eyes: Denied eye symptoms, eye pain, photophobia, vision change and visual disturbance.  Ears/Nose/Throat/Neck: Denied ear, nose, throat or neck symptoms, hearing loss, nasal discharge, sinus congestion and sore throat.  Cardiovascular: Denied cardiovascular symptoms, arrhythmia, chest pain/pressure, edema, exercise intolerance, orthopnea and palpitations.  Respiratory: Denied pulmonary symptoms, asthma, pleuritic pain, productive sputum, cough, dyspnea and wheezing.  Gastrointestinal: Denied, gastro-esophageal reflux,  melena, nausea and vomiting.  Genitourinary: Denied genitourinary symptoms including symptomatic vaginal discharge, pelvic relaxation issues, and urinary complaints.  Musculoskeletal: Denied musculoskeletal symptoms, stiffness, swelling, muscle weakness and myalgia.  Dermatologic: Denied dermatology symptoms, rash and scar.  Neurologic: Denied neurology symptoms, dizziness, headache, neck pain and syncope.  Psychiatric: Denied psychiatric symptoms, anxiety and depression.  Endocrine: Denied endocrine symptoms including hot flashes and night sweats.   Meds:   Current Outpatient Medications on File Prior to Visit  Medication Sig Dispense Refill  . Buprenorphine HCl-Naloxone HCl (SUBOXONE) 8-2 MG FILM Place under the tongue 2 (two) times daily.    . metroNIDAZOLE (FLAGYL) 500 MG tablet Take 500 mg by mouth.    . Prenatal Vit-Fe Fumarate-FA (PRENATAL MULTIVITAMIN) TABS tablet Take 1 tablet by mouth daily at 12 noon.     No current facility-administered medications on file prior to visit.     Objective:     Vitals:   12/11/17 1003  BP: (!) 87/56  Pulse: 91              Ultrasound results reviewed directly with the patient (these were from [facility])  Quantitative beta hCGs reviewed  Assessment:    [id number] There are no active problems to display for this patient.    1. Missed ab     Likely missed AB but patient not convinced.  She does state that if it is a definite miscarriage she is interested in definitive surgery.   Plan:            1.  Ultrasound to confirm nonviable pregnancy  2.  Consider DNA if nonviable.  SURGERY (D&E)  Procedure over in 1 day  Requires being put to sleep  Bleeding may be light  Possible problems during surgery, including injury to womb(uterus)  Care provider has more control Medicine (CYTOTEC)  The complete procedure may take days to weeks  No Surgery  Bleeding may be heavy at times  There may be drug side effects  Patient has  more control Waiting  You may choose to wait, in which case your own body may complete the passing of the abnormal early pregnancy on its own in about 2-4 weeks  Your bleeding may be heavy at times  There is a small possibility that you may need surgery if the bleeding is too much or not all of the pregnancy has passed.  The above options have been described to her in detail and she has decided upon D and E if the pregnancy is nonviable.  Orders No orders of the defined types were placed in this encounter.   No orders of the defined types were placed in this encounter.     F/U  Return in about 1 day (around 12/12/2017). I spent 33 minutes with this patient of which greater than 50% was spent discussing pregnancy, quantitative beta hCG levels, nonviable appearance, crown-rump length, possibilities for resolution of pending miscarriage.  All the patient's questions were answered and her concerns addressed.  Finis Bud, M.D. 12/11/2017 11:16 AM

## 2017-12-12 ENCOUNTER — Encounter: Payer: Medicaid Other | Admitting: Obstetrics and Gynecology

## 2017-12-24 ENCOUNTER — Encounter: Payer: Self-pay | Admitting: Emergency Medicine

## 2017-12-24 ENCOUNTER — Ambulatory Visit (INDEPENDENT_AMBULATORY_CARE_PROVIDER_SITE_OTHER): Payer: Medicaid Other | Admitting: Obstetrics and Gynecology

## 2017-12-24 ENCOUNTER — Encounter: Payer: Self-pay | Admitting: Obstetrics and Gynecology

## 2017-12-24 ENCOUNTER — Other Ambulatory Visit: Payer: Self-pay

## 2017-12-24 ENCOUNTER — Emergency Department
Admission: EM | Admit: 2017-12-24 | Discharge: 2017-12-24 | Disposition: A | Discharge status: Home / Self Care | Payer: Medicaid Other | Attending: Emergency Medicine | Admitting: Emergency Medicine

## 2017-12-24 VITALS — BP 100/68 | HR 83 | Wt 109.3 lb

## 2017-12-24 DIAGNOSIS — F17210 Nicotine dependence, cigarettes, uncomplicated: Secondary | ICD-10-CM | POA: Diagnosis not present

## 2017-12-24 DIAGNOSIS — Z79899 Other long term (current) drug therapy: Secondary | ICD-10-CM | POA: Diagnosis not present

## 2017-12-24 DIAGNOSIS — R102 Pelvic and perineal pain: Secondary | ICD-10-CM | POA: Diagnosis not present

## 2017-12-24 DIAGNOSIS — O99320 Drug use complicating pregnancy, unspecified trimester: Secondary | ICD-10-CM

## 2017-12-24 DIAGNOSIS — R52 Pain, unspecified: Secondary | ICD-10-CM

## 2017-12-24 DIAGNOSIS — Z3A00 Weeks of gestation of pregnancy not specified: Secondary | ICD-10-CM | POA: Diagnosis not present

## 2017-12-24 DIAGNOSIS — O021 Missed abortion: Secondary | ICD-10-CM | POA: Diagnosis not present

## 2017-12-24 DIAGNOSIS — N939 Abnormal uterine and vaginal bleeding, unspecified: Secondary | ICD-10-CM

## 2017-12-24 DIAGNOSIS — Z7689 Persons encountering health services in other specified circumstances: Secondary | ICD-10-CM

## 2017-12-24 DIAGNOSIS — O209 Hemorrhage in early pregnancy, unspecified: Secondary | ICD-10-CM | POA: Diagnosis not present

## 2017-12-24 DIAGNOSIS — F1910 Other psychoactive substance abuse, uncomplicated: Secondary | ICD-10-CM

## 2017-12-24 DIAGNOSIS — O0289 Other abnormal products of conception: Secondary | ICD-10-CM

## 2017-12-24 LAB — COMPREHENSIVE METABOLIC PANEL
ALK PHOS: 43 U/L (ref 38–126)
ALT: 10 U/L — ABNORMAL LOW (ref 14–54)
ANION GAP: 8 (ref 5–15)
AST: 20 U/L (ref 15–41)
Albumin: 4.6 g/dL (ref 3.5–5.0)
BUN: 5 mg/dL — ABNORMAL LOW (ref 6–20)
CHLORIDE: 102 mmol/L (ref 101–111)
CO2: 24 mmol/L (ref 22–32)
Calcium: 9.3 mg/dL (ref 8.9–10.3)
Creatinine, Ser: 0.61 mg/dL (ref 0.44–1.00)
GFR calc Af Amer: >60 mL/min (ref >60)
GFR calc non Af Amer: >60 mL/min (ref >60)
Glucose, Bld: 116 mg/dL — ABNORMAL HIGH (ref 65–99)
POTASSIUM: 3.6 mmol/L (ref 3.5–5.1)
SODIUM: 134 mmol/L — AB (ref 135–145)
Total Bilirubin: 0.6 mg/dL (ref 0.3–1.2)
Total Protein: 7.9 g/dL (ref 6.5–8.1)

## 2017-12-24 LAB — CBC WITH DIFFERENTIAL/PLATELET
BASOS PCT: 1 %
Basophils Absolute: 0.1 10*3/uL (ref 0–0.1)
EOS ABS: 0.4 10*3/uL (ref 0–0.7)
EOS PCT: 5 %
HCT: 44.2 % (ref 35.0–47.0)
HEMOGLOBIN: 14.6 g/dL (ref 12.0–16.0)
Lymphocytes Relative: 32 %
Lymphs Abs: 2.2 10*3/uL (ref 1.0–3.6)
MCH: 28.7 pg (ref 26.0–34.0)
MCHC: 33.1 g/dL (ref 32.0–36.0)
MCV: 86.5 fL (ref 80.0–100.0)
MONOS PCT: 7 %
Monocytes Absolute: 0.5 10*3/uL (ref 0.2–0.9)
NEUTROS PCT: 55 %
Neutro Abs: 3.8 10*3/uL (ref 1.4–6.5)
PLATELETS: 251 10*3/uL (ref 150–440)
RBC: 5.11 MIL/uL (ref 3.80–5.20)
RDW: 15.5 % — AB (ref 11.5–14.5)
WBC: 7 10*3/uL (ref 3.6–11.0)

## 2017-12-24 LAB — URINALYSIS, COMPLETE (UACMP) WITH MICROSCOPIC
BILIRUBIN URINE: NEGATIVE
Glucose, UA: NEGATIVE mg/dL
KETONES UR: NEGATIVE mg/dL
NITRITE: NEGATIVE
PROTEIN: NEGATIVE mg/dL
Specific Gravity, Urine: 1 — ABNORMAL LOW (ref 1.005–1.030)
pH: 7 (ref 5.0–8.0)

## 2017-12-24 LAB — HCG, QUANTITATIVE, PREGNANCY: hCG, Beta Chain, Quant, S: 7108 m[IU]/mL — ABNORMAL HIGH (ref <5)

## 2017-12-24 MED ORDER — HYDROMORPHONE HCL 1 MG/ML IJ SOLN
1.0000 mg | Freq: Once | INTRAMUSCULAR | Status: AC
  Administered 2017-12-24: 1 mg via INTRAMUSCULAR

## 2017-12-24 MED ORDER — HYDROMORPHONE HCL 1 MG/ML IJ SOLN
INTRAMUSCULAR | Status: AC
  Administered 2017-12-24: 1 mg via INTRAMUSCULAR
  Filled 2017-12-24: qty 1

## 2017-12-24 NOTE — ED Provider Notes (Signed)
[facility] Emergency Department Provider Note    First MD Initiated Contact with Patient 12/24/17 0250     (approximate)  I have reviewed the triage vital signs and the nursing notes.   HISTORY  Chief Complaint Vaginal Bleeding    HPI Anna Ferguson is a 32 y.o. female G5 P2 to previous miscarriage and recently diagnosed intrauterine fetal demise for which the [facility] was [facility] presents to the emergency department with onset of pelvic discomfort described as cramping and vaginal bleeding which began tonight.  Patient states her current pain score is 9 out of 10.  Patient states that she went to multiple hospitals for evaluation of possible fetal demise secondary to "not wanting to believe that it was real".  Patient states that she was informed by [facility] and [facility] that a D&C would need to be performed which she is now agreeable to have done.  Patient denies any aggravating or alleviating factors for her pain.  Past Medical History:  Diagnosis Date  . Miscarriage     There are no active problems to display for this patient.   Past Surgical History:  Procedure Laterality Date  . DILATION AND CURETTAGE OF UTERUS      Prior to Admission medications   Medication Sig Start Date End Date Taking? Authorizing Provider  Buprenorphine HCl-Naloxone HCl (SUBOXONE) 8-2 MG FILM Place under the tongue 2 (two) times daily.   Yes [provider]  Prenatal Vit-Fe Fumarate-FA (PRENATAL MULTIVITAMIN) TABS tablet Take 1 tablet by mouth daily at 12 noon.   Yes [provider]    Allergies Gabapentin  Family History  Problem Relation Age of Onset  . Cancer Neg Hx   . Diabetes Neg Hx   . Stroke Neg Hx   . Heart disease Neg Hx     Social History Social History   Tobacco Use  . Smoking status: Current Every Day Smoker    Packs/day: 0.50    Types: Cigarettes  . Smokeless tobacco: Never Used    Substance Use Topics  . Alcohol use: No    Frequency: Never  . Drug use: No    Review of Systems Constitutional: No fever/chills Eyes: No visual changes. ENT: No sore throat. Cardiovascular: Denies chest pain. Respiratory: Denies shortness of breath. Gastrointestinal: No abdominal pain.  No nausea, no vomiting.  No diarrhea.  No constipation. Genitourinary: Negative for dysuria.  Positive for vaginal bleeding and pelvic pain Musculoskeletal: Negative for neck pain.  Negative for back pain. Integumentary: Negative for rash. Neurological: Negative for headaches, focal weakness or numbness.   ____________________________________________   PHYSICAL EXAM:  VITAL SIGNS: ED Triage Vitals [12/24/17 0102]  Enc Vitals Group     BP      Pulse      Resp      Temp      Temp src      SpO2      Weight 49.4 kg (109 lb)     Height 1.727 m (5\' 8" )     Head Circumference      Peak Flow      Pain Score 8     Pain Loc      Pain Edu?      Excl. in [place]?     Constitutional: Alert and oriented. Well appearing and in no acute distress. Eyes: Conjunctivae are normal. PERRL. EOMI. Head: Atraumatic. Mouth/Throat: Mucous membranes are moist.  Oropharynx non-erythematous. Neck: No stridor.  Cardiovascular: Normal rate, regular rhythm. Good peripheral circulation. Grossly normal heart sounds. Respiratory: Normal respiratory effort.  No retractions. Lungs CTAB. Gastrointestinal: Soft and nontender. No distention.  Genitourinary: Mild vaginal bleeding (dark blood)  Musculoskeletal: No lower extremity tenderness nor edema. No gross deformities of extremities. Neurologic:  Normal speech and language. No gross focal neurologic deficits are appreciated.  Skin:  Skin is warm, dry and intact. No rash noted. Psychiatric: Mood and affect are normal. Speech and behavior are normal.  ____________________________________________   LABS (all labs ordered are listed, but only abnormal results are  displayed)  Labs Reviewed  HCG, QUANTITATIVE, PREGNANCY - Abnormal; Notable for the following components:      Result Value   hCG, Beta Chain, Quant, S 7,108 (*)    All other components within normal limits  CBC WITH DIFFERENTIAL/PLATELET - Abnormal; Notable for the following components:   RDW 15.5 (*)    All other components within normal limits  COMPREHENSIVE METABOLIC PANEL - Abnormal; Notable for the following components:   Sodium 134 (*)    Glucose, Bld 116 (*)    BUN 5 (*)    ALT 10 (*)    All other components within normal limits  URINALYSIS, COMPLETE (UACMP) WITH MICROSCOPIC - Abnormal; Notable for the following components:   Color, Urine STRAW (*)    APPearance CLEAR (*)    Specific Gravity, Urine 1.000 (*)    Hgb urine dipstick LARGE (*)    Leukocytes, UA TRACE (*)    Bacteria, UA RARE (*)    Squamous Epithelial / LPF 0-5 (*)    All other components within normal limits     Procedures   ____________________________________________   INITIAL IMPRESSION / ASSESSMENT AND PLAN / ED COURSE  As part of my medical decision making, I reviewed the following data within the electronic MEDICAL RECORD [id number] year old female presented with above-stated history and physical exam with concern for intrauterine fetal demise.  HCG quantitative revealed decrease to 7108 which 20500 12/08/2017.  Spoke with the patient regarding repeat ultrasound which he would rather forego at this time patient is requesting D&C be performed.  Patient discussed with Dr. Amalia Hailey who advised that he will see the patient in the office today with plan for D&C to be performed potentially tomorrow.  _______________________________  FINAL CLINICAL IMPRESSION(S) / ED DIAGNOSES  Final diagnoses:  Vaginal bleeding  Pain  Encounter for assessment for fetal demise     MEDICATIONS GIVEN DURING THIS VISIT:  Medications  HYDROmorphone (DILAUDID) 1 MG/ML injection (not administered)     ED Discharge Orders     None       Note:  This document was prepared using Dragon voice recognition software and may include unintentional dictation errors.    Gregor Hams, MD 12/24/17 [phone]

## 2017-12-24 NOTE — Progress Notes (Signed)
PRE-OPERATIVE HISTORY AND PHYSICAL EXAM  PCP:  Patient, No Pcp Per Subjective:   HPI:  Anna Ferguson is a 32 y.o. [id number].  Patient's last menstrual period was 10/06/2017 (approximate).  She presents today for a pre-op discussion and PE.  She has the following symptoms: She has previously been diagnosed with a missed AB.  She was seen for nonviable first trimester pregnancy at [facility].  She presented here for a "second opinion".  She underwent  another ultrasound again revealing a nonviable first trimester pregnancy and then did not return for her follow-up visit. She presented to the emergency department yesterday complaining of the start of some vaginal bleeding.  She denies cramping denies passage of tissue or blood clots.  She has stated that she has finally come to terms with a pregnancy loss.  She has expressed her interest in "D&C". 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  Admits to cocaine use approximately a week ago.  Review of Systems:   Constitutional: Denied constitutional symptoms, night sweats, recent illness, fatigue, fever, insomnia and weight loss.  Eyes: Denied eye symptoms, eye pain, photophobia, vision change and visual disturbance.  Ears/Nose/Throat/Neck: Denied ear, nose, throat or neck symptoms, hearing loss, nasal discharge, sinus congestion and sore throat.  Cardiovascular: Denied cardiovascular symptoms, arrhythmia, chest pain/pressure, edema, exercise intolerance, orthopnea and palpitations.  Respiratory: Denied pulmonary symptoms, asthma, pleuritic pain, productive sputum, cough, dyspnea and wheezing.  Gastrointestinal: Denied, gastro-esophageal reflux, melena, nausea and vomiting.  Genitourinary: See HPI for additional information.  Musculoskeletal: Denied musculoskeletal symptoms, stiffness, swelling, muscle weakness and myalgia.  Dermatologic: Denied dermatology symptoms, rash and scar.    Neurologic: Denied neurology symptoms, dizziness, headache, neck pain and syncope.  Psychiatric: Denied psychiatric symptoms, anxiety and depression.  Endocrine: Denied endocrine symptoms including hot flashes and night sweats.   OB History  Gravida Para Term Preterm AB Living  5 2 2   2 2   SAB TAB Ectopic Multiple Live Births  2       2    # Outcome Date GA Lbr Len/2nd Weight Sex Delivery Anes PTL Lv  5 Current           4 Term 10/29/13 [redacted]w[redacted]d   F Vag-Spont   LIV  3 Term 01/16/11 [redacted]w[redacted]d   M Vag-Spont   LIV  2 SAB           1 SAB               Past Medical History:  Diagnosis Date  . Miscarriage     Past Surgical History:  Procedure Laterality Date  . DILATION AND CURETTAGE OF UTERUS        SOCIAL HISTORY: Social History   Tobacco Use  Smoking Status Current Every Day Smoker  . Packs/day: 0.50  . Types: Cigarettes  Smokeless Tobacco Never Used   Social History   Substance and Sexual Activity  Alcohol Use No  . Frequency: Never   Social History   Substance and Sexual Activity  Drug Use No    Family History  Problem Relation Age of Onset  . Cancer Neg Hx   . Diabetes Neg Hx   . Stroke Neg Hx   . Heart disease Neg Hx     ALLERGIES:  Gabapentin  MEDS:   Current Outpatient Medications on File Prior to Visit  Medication Sig Dispense Refill  . Buprenorphine HCl-Naloxone HCl (SUBOXONE) 8-2 MG FILM  Place under the tongue 2 (two) times daily.    . Prenatal Vit-Fe Fumarate-FA (PRENATAL MULTIVITAMIN) TABS tablet Take 1 tablet by mouth daily at 12 noon.     No current facility-administered medications on file prior to visit.     No orders of the defined types were placed in this encounter.    Physical examination BP 100/68 (BP Location: Left Arm, Patient Position: Sitting, Cuff Size: Small)   Pulse 83   Wt 109 lb 4.8 oz (49.6 kg)   LMP 10/06/2017 (Approximate)   BMI 16.62 kg/m   General NAD, Conversant  HEENT Atraumatic; Op clear with mmm.   Normo-cephalic. Pupils reactive. Anicteric sclerae  Thyroid/Neck Smooth without nodularity or enlargement. Normal ROM.  Neck Supple.  Skin No rashes, lesions or ulceration. Normal palpated skin turgor. No nodularity.  Breasts: No masses or discharge.  Symmetric.  No axillary adenopathy.  Lungs: Clear to auscultation.No rales or wheezes. Normal Respiratory effort, no retractions.  Heart: NSR.  No murmurs or rubs appreciated. No periferal edema  Abdomen: Soft.  Non-tender.  No masses.  No HSM. No hernia  Extremities: Moves all appropriately.  Normal ROM for age. No lymphadenopathy.  Neuro: Oriented to PPT.  Normal mood. Normal affect.   Pelvic and bimanual examination deferred at this time.  Will do in the OR.  See report from ED.   Assessment:   [id number]  1. Missed ab   2. Drug abuse during pregnancy [facility])      Plan:   Orders: No orders of the defined types were placed in this encounter.    1.  D&E  Pre-op discussions regarding Risks and Benefits of her scheduled surgery.  D&E The procedure and the risks and benefits of dilation and curettage/evacuation have been explained to the patient.  The specific risks of bleeding, infection, anesthesia, uterine perforation, and damage to bowel or bladder  have been specifically discussed.  I have answered all of her questions and I believe that she has an adequate and informed understanding of this procedure.   Finis Bud, M.D. 12/24/2017 11:01 AM

## 2017-12-24 NOTE — H&P (Signed)
PRE-OPERATIVE HISTORY AND PHYSICAL EXAM  PCP:  Patient, No Pcp Per Subjective:   HPI:  Anna Ferguson is a 32 y.o. [id number].  Patient's last menstrual period was 10/06/2017 (approximate).  She presents today for a pre-op discussion and PE.  She has the following symptoms: She has previously been diagnosed with a missed AB.  She was seen for nonviable first trimester pregnancy at [facility].  She presented here for a "second opinion".  She underwent  another ultrasound again revealing a nonviable first trimester pregnancy and then did not return for her follow-up visit. She presented to the emergency department yesterday complaining of the start of some vaginal bleeding.  She denies cramping denies passage of tissue or blood clots.  She has stated that she has finally come to terms with a pregnancy loss.  She has expressed her interest in "D&C". 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  Admits to cocaine use approximately a week ago.  Review of Systems:   Constitutional: Denied constitutional symptoms, night sweats, recent illness, fatigue, fever, insomnia and weight loss.  Eyes: Denied eye symptoms, eye pain, photophobia, vision change and visual disturbance.  Ears/Nose/Throat/Neck: Denied ear, nose, throat or neck symptoms, hearing loss, nasal discharge, sinus congestion and sore throat.  Cardiovascular: Denied cardiovascular symptoms, arrhythmia, chest pain/pressure, edema, exercise intolerance, orthopnea and palpitations.  Respiratory: Denied pulmonary symptoms, asthma, pleuritic pain, productive sputum, cough, dyspnea and wheezing.  Gastrointestinal: Denied, gastro-esophageal reflux, melena, nausea and vomiting.  Genitourinary: See HPI for additional information.  Musculoskeletal: Denied musculoskeletal symptoms, stiffness, swelling, muscle weakness and myalgia.  Dermatologic: Denied dermatology symptoms, rash and scar.    Neurologic: Denied neurology symptoms, dizziness, headache, neck pain and syncope.  Psychiatric: Denied psychiatric symptoms, anxiety and depression.  Endocrine: Denied endocrine symptoms including hot flashes and night sweats.   OB History  Gravida Para Term Preterm AB Living  5 2 2   2 2   SAB TAB Ectopic Multiple Live Births  2       2    # Outcome Date GA Lbr Len/2nd Weight Sex Delivery Anes PTL Lv  5 Current           4 Term 10/29/13 [redacted]w[redacted]d   F Vag-Spont   LIV  3 Term 01/16/11 [redacted]w[redacted]d   M Vag-Spont   LIV  2 SAB           1 SAB               Past Medical History:  Diagnosis Date  . Miscarriage     Past Surgical History:  Procedure Laterality Date  . DILATION AND CURETTAGE OF UTERUS        SOCIAL HISTORY: Social History   Tobacco Use  Smoking Status Current Every Day Smoker  . Packs/day: 0.50  . Types: Cigarettes  Smokeless Tobacco Never Used   Social History   Substance and Sexual Activity  Alcohol Use No  . Frequency: Never   Social History   Substance and Sexual Activity  Drug Use No    Family History  Problem Relation Age of Onset  . Cancer Neg Hx   . Diabetes Neg Hx   . Stroke Neg Hx   . Heart disease Neg Hx     ALLERGIES:  Gabapentin  MEDS:   Current Outpatient Medications on File Prior to Visit  Medication Sig Dispense Refill  . Buprenorphine HCl-Naloxone HCl (SUBOXONE) 8-2 MG FILM  Place under the tongue 2 (two) times daily.    . Prenatal Vit-Fe Fumarate-FA (PRENATAL MULTIVITAMIN) TABS tablet Take 1 tablet by mouth daily at 12 noon.     No current facility-administered medications on file prior to visit.     No orders of the defined types were placed in this encounter.    Physical examination BP 100/68 (BP Location: Left Arm, Patient Position: Sitting, Cuff Size: Small)   Pulse 83   Wt 109 lb 4.8 oz (49.6 kg)   LMP 10/06/2017 (Approximate)   BMI 16.62 kg/m   General NAD, Conversant  HEENT Atraumatic; Op clear with mmm.   Normo-cephalic. Pupils reactive. Anicteric sclerae  Thyroid/Neck Smooth without nodularity or enlargement. Normal ROM.  Neck Supple.  Skin No rashes, lesions or ulceration. Normal palpated skin turgor. No nodularity.  Breasts: No masses or discharge.  Symmetric.  No axillary adenopathy.  Lungs: Clear to auscultation.No rales or wheezes. Normal Respiratory effort, no retractions.  Heart: NSR.  No murmurs or rubs appreciated. No periferal edema  Abdomen: Soft.  Non-tender.  No masses.  No HSM. No hernia  Extremities: Moves all appropriately.  Normal ROM for age. No lymphadenopathy.  Neuro: Oriented to PPT.  Normal mood. Normal affect.   Pelvic and bimanual examination deferred at this time.  Will do in the OR.  See report from ED.   Assessment:   [id number]  1. Missed ab   2. Drug abuse during pregnancy [facility])      Plan:   Orders: No orders of the defined types were placed in this encounter.    1.  D&E  Pre-op discussions regarding Risks and Benefits of her scheduled surgery.  D&E The procedure and the risks and benefits of dilation and curettage/evacuation have been explained to the patient.  The specific risks of bleeding, infection, anesthesia, uterine perforation, and damage to bowel or bladder  have been specifically discussed.  I have answered all of her questions and I believe that she has an adequate and informed understanding of this procedure.   Finis Bud, M.D. 12/24/2017 11:01 AM

## 2017-12-24 NOTE — H&P (View-Only) (Signed)
PRE-OPERATIVE HISTORY AND PHYSICAL EXAM  PCP:  Patient, No Pcp Per Subjective:   HPI:  Anna Ferguson is a 32 y.o. [id number].  Patient's last menstrual period was 10/06/2017 (approximate).  She presents today for a pre-op discussion and PE.  She has the following symptoms: She has previously been diagnosed with a missed AB.  She was seen for nonviable first trimester pregnancy at [facility].  She presented here for a "second opinion".  She underwent  another ultrasound again revealing a nonviable first trimester pregnancy and then did not return for her follow-up visit. She presented to the emergency department yesterday complaining of the start of some vaginal bleeding.  She denies cramping denies passage of tissue or blood clots.  She has stated that she has finally come to terms with a pregnancy loss.  She has expressed her interest in "D&C". 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  Admits to cocaine use approximately a week ago.  Review of Systems:   Constitutional: Denied constitutional symptoms, night sweats, recent illness, fatigue, fever, insomnia and weight loss.  Eyes: Denied eye symptoms, eye pain, photophobia, vision change and visual disturbance.  Ears/Nose/Throat/Neck: Denied ear, nose, throat or neck symptoms, hearing loss, nasal discharge, sinus congestion and sore throat.  Cardiovascular: Denied cardiovascular symptoms, arrhythmia, chest pain/pressure, edema, exercise intolerance, orthopnea and palpitations.  Respiratory: Denied pulmonary symptoms, asthma, pleuritic pain, productive sputum, cough, dyspnea and wheezing.  Gastrointestinal: Denied, gastro-esophageal reflux, melena, nausea and vomiting.  Genitourinary: See HPI for additional information.  Musculoskeletal: Denied musculoskeletal symptoms, stiffness, swelling, muscle weakness and myalgia.  Dermatologic: Denied dermatology symptoms, rash and scar.    Neurologic: Denied neurology symptoms, dizziness, headache, neck pain and syncope.  Psychiatric: Denied psychiatric symptoms, anxiety and depression.  Endocrine: Denied endocrine symptoms including hot flashes and night sweats.   OB History  Gravida Para Term Preterm AB Living  5 2 2   2 2   SAB TAB Ectopic Multiple Live Births  2       2    # Outcome Date GA Lbr Len/2nd Weight Sex Delivery Anes PTL Lv  5 Current           4 Term 10/29/13 [redacted]w[redacted]d   F Vag-Spont   LIV  3 Term 01/16/11 [redacted]w[redacted]d   M Vag-Spont   LIV  2 SAB           1 SAB               Past Medical History:  Diagnosis Date  . Miscarriage     Past Surgical History:  Procedure Laterality Date  . DILATION AND CURETTAGE OF UTERUS        SOCIAL HISTORY: Social History   Tobacco Use  Smoking Status Current Every Day Smoker  . Packs/day: 0.50  . Types: Cigarettes  Smokeless Tobacco Never Used   Social History   Substance and Sexual Activity  Alcohol Use No  . Frequency: Never   Social History   Substance and Sexual Activity  Drug Use No    Family History  Problem Relation Age of Onset  . Cancer Neg Hx   . Diabetes Neg Hx   . Stroke Neg Hx   . Heart disease Neg Hx     ALLERGIES:  Gabapentin  MEDS:   Current Outpatient Medications on File Prior to Visit  Medication Sig Dispense Refill  . Buprenorphine HCl-Naloxone HCl (SUBOXONE) 8-2 MG FILM  Place under the tongue 2 (two) times daily.    . Prenatal Vit-Fe Fumarate-FA (PRENATAL MULTIVITAMIN) TABS tablet Take 1 tablet by mouth daily at 12 noon.     No current facility-administered medications on file prior to visit.     No orders of the defined types were placed in this encounter.    Physical examination BP 100/68 (BP Location: Left Arm, Patient Position: Sitting, Cuff Size: Small)   Pulse 83   Wt 109 lb 4.8 oz (49.6 kg)   LMP 10/06/2017 (Approximate)   BMI 16.62 kg/m   General NAD, Conversant  HEENT Atraumatic; Op clear with mmm.   Normo-cephalic. Pupils reactive. Anicteric sclerae  Thyroid/Neck Smooth without nodularity or enlargement. Normal ROM.  Neck Supple.  Skin No rashes, lesions or ulceration. Normal palpated skin turgor. No nodularity.  Breasts: No masses or discharge.  Symmetric.  No axillary adenopathy.  Lungs: Clear to auscultation.No rales or wheezes. Normal Respiratory effort, no retractions.  Heart: NSR.  No murmurs or rubs appreciated. No periferal edema  Abdomen: Soft.  Non-tender.  No masses.  No HSM. No hernia  Extremities: Moves all appropriately.  Normal ROM for age. No lymphadenopathy.  Neuro: Oriented to PPT.  Normal mood. Normal affect.   Pelvic and bimanual examination deferred at this time.  Will do in the OR.  See report from ED.   Assessment:   [id number]  1. Missed ab   2. Drug abuse during pregnancy [facility])      Plan:   Orders: No orders of the defined types were placed in this encounter.    1.  D&E  Pre-op discussions regarding Risks and Benefits of her scheduled surgery.  D&E The procedure and the risks and benefits of dilation and curettage/evacuation have been explained to the patient.  The specific risks of bleeding, infection, anesthesia, uterine perforation, and damage to bowel or bladder  have been specifically discussed.  I have answered all of her questions and I believe that she has an adequate and informed understanding of this procedure.   Finis Bud, M.D. 12/24/2017 11:01 AM

## 2017-12-24 NOTE — Progress Notes (Signed)
Patient comes in today for ER follow up.

## 2017-12-24 NOTE — ED Triage Notes (Addendum)
Patient ambulatory to triage with steady gait, without difficulty or distress noted; pt reports since last night having vag bleeding and cramping; pt approx [redacted]wks pregnant; G5,P2; st u/s did not indicate FHT; st she was told a D&C was recommended but hasn't scheduled it as "she didn't want to believe it"; currently taking suboxone; recent u/s results noted and verified with Dr Owens Shark no repeat to be done at this time

## 2017-12-26 ENCOUNTER — Ambulatory Visit: Payer: Medicaid Other | Admitting: Anesthesiology

## 2017-12-26 ENCOUNTER — Encounter
Admission: RE | Disposition: A | Discharge status: Home / Self Care | Payer: Self-pay | Source: Ambulatory Visit | Attending: Obstetrics and Gynecology

## 2017-12-26 ENCOUNTER — Other Ambulatory Visit: Payer: Self-pay

## 2017-12-26 ENCOUNTER — Encounter: Payer: Self-pay | Admitting: *Deleted

## 2017-12-26 ENCOUNTER — Ambulatory Visit
Admission: RE | Admit: 2017-12-26 | Discharge: 2017-12-26 | Disposition: A | Discharge status: Home / Self Care | Payer: Medicaid Other | Source: Ambulatory Visit | Attending: Obstetrics and Gynecology | Admitting: Obstetrics and Gynecology

## 2017-12-26 DIAGNOSIS — Z888 Allergy status to other drugs, medicaments and biological substances status: Secondary | ICD-10-CM | POA: Diagnosis not present

## 2017-12-26 DIAGNOSIS — O021 Missed abortion: Secondary | ICD-10-CM | POA: Insufficient documentation

## 2017-12-26 DIAGNOSIS — F17210 Nicotine dependence, cigarettes, uncomplicated: Secondary | ICD-10-CM | POA: Insufficient documentation

## 2017-12-26 HISTORY — PX: DILATION AND EVACUATION: SHX1459

## 2017-12-26 LAB — TYPE AND SCREEN
ABO/RH(D): A POS
ANTIBODY SCREEN: NEGATIVE

## 2017-12-26 LAB — URINE DRUG SCREEN, QUALITATIVE (ARMC ONLY)
AMPHETAMINES, UR SCREEN: NOT DETECTED
BENZODIAZEPINE, UR SCRN: POSITIVE — AB
Barbiturates, Ur Screen: NOT DETECTED
CANNABINOID 50 NG, UR ~~LOC~~: NOT DETECTED
Cocaine Metabolite,Ur ~~LOC~~: NOT DETECTED
MDMA (Ecstasy)Ur Screen: NOT DETECTED
Methadone Scn, Ur: NOT DETECTED
Opiate, Ur Screen: NOT DETECTED
PHENCYCLIDINE (PCP) UR S: NOT DETECTED
TRICYCLIC, UR SCREEN: NOT DETECTED

## 2017-12-26 LAB — CBC
HEMATOCRIT: 39.8 % (ref 35.0–47.0)
Hemoglobin: 13.4 g/dL (ref 12.0–16.0)
MCH: 28.8 pg (ref 26.0–34.0)
MCHC: 33.6 g/dL (ref 32.0–36.0)
MCV: 85.8 fL (ref 80.0–100.0)
Platelets: 222 10*3/uL (ref 150–440)
RBC: 4.65 MIL/uL (ref 3.80–5.20)
RDW: 15.4 % — AB (ref 11.5–14.5)
WBC: 5.3 10*3/uL (ref 3.6–11.0)

## 2017-12-26 SURGERY — DILATION AND EVACUATION, UTERUS
Anesthesia: General

## 2017-12-26 MED ORDER — FENTANYL CITRATE (PF) 100 MCG/2ML IJ SOLN
INTRAMUSCULAR | Status: AC
  Administered 2017-12-26: 25 ug via INTRAVENOUS
  Filled 2017-12-26: qty 2

## 2017-12-26 MED ORDER — ONDANSETRON HCL 4 MG/2ML IJ SOLN
INTRAMUSCULAR | Status: DC | PRN
  Administered 2017-12-26: 4 mg via INTRAVENOUS

## 2017-12-26 MED ORDER — FENTANYL CITRATE (PF) 100 MCG/2ML IJ SOLN
INTRAMUSCULAR | Status: AC
  Filled 2017-12-26: qty 2

## 2017-12-26 MED ORDER — MIDAZOLAM HCL 2 MG/2ML IJ SOLN
INTRAMUSCULAR | Status: AC
  Filled 2017-12-26: qty 2

## 2017-12-26 MED ORDER — PROPOFOL 10 MG/ML IV BOLUS
INTRAVENOUS | Status: DC | PRN
  Administered 2017-12-26: 50 mg via INTRAVENOUS
  Administered 2017-12-26: 150 mg via INTRAVENOUS

## 2017-12-26 MED ORDER — PROPOFOL 10 MG/ML IV BOLUS
INTRAVENOUS | Status: AC
Start: 2017-12-26 — End: ?
  Filled 2017-12-26: qty 20

## 2017-12-26 MED ORDER — FAMOTIDINE 20 MG PO TABS
ORAL_TABLET | ORAL | Status: AC
  Filled 2017-12-26: qty 1

## 2017-12-26 MED ORDER — HYDROMORPHONE HCL 1 MG/ML IJ SOLN
0.5000 mg | INTRAMUSCULAR | Status: DC | PRN
  Administered 2017-12-26: 0.5 mg via INTRAVENOUS

## 2017-12-26 MED ORDER — ONDANSETRON HCL 4 MG/2ML IJ SOLN: 4.0000 mg | Freq: Once | INTRAMUSCULAR | Status: DC | PRN

## 2017-12-26 MED ORDER — LACTATED RINGERS IV SOLN: INTRAVENOUS | Status: DC

## 2017-12-26 MED ORDER — SUCCINYLCHOLINE CHLORIDE 20 MG/ML IJ SOLN
INTRAMUSCULAR | Status: DC | PRN
  Administered 2017-12-26: 100 mg via INTRAVENOUS

## 2017-12-26 MED ORDER — OXYTOCIN 10 UNIT/ML IJ SOLN
INTRAMUSCULAR | Status: AC
  Filled 2017-12-26: qty 1

## 2017-12-26 MED ORDER — LACTATED RINGERS IV SOLN
INTRAVENOUS | Status: DC
  Administered 2017-12-26: 10:00:00 via INTRAVENOUS

## 2017-12-26 MED ORDER — LACTATED RINGERS IV SOLN
INTRAVENOUS | Status: DC
  Administered 2017-12-26: 13:00:00 via INTRAVENOUS

## 2017-12-26 MED ORDER — MIDAZOLAM HCL 2 MG/2ML IJ SOLN
INTRAMUSCULAR | Status: DC | PRN
  Administered 2017-12-26: 2 mg via INTRAVENOUS

## 2017-12-26 MED ORDER — HYDROMORPHONE HCL 1 MG/ML IJ SOLN
INTRAMUSCULAR | Status: AC
  Administered 2017-12-26: 0.5 mg via INTRAVENOUS
  Filled 2017-12-26: qty 1

## 2017-12-26 MED ORDER — ROCURONIUM BROMIDE 100 MG/10ML IV SOLN
INTRAVENOUS | Status: DC | PRN
  Administered 2017-12-26: 30 mg via INTRAVENOUS
  Administered 2017-12-26: 10 mg via INTRAVENOUS

## 2017-12-26 MED ORDER — FENTANYL CITRATE (PF) 100 MCG/2ML IJ SOLN
INTRAMUSCULAR | Status: DC | PRN
  Administered 2017-12-26: 100 ug via INTRAVENOUS

## 2017-12-26 MED ORDER — FAMOTIDINE 20 MG PO TABS
20.0000 mg | ORAL_TABLET | Freq: Once | ORAL | Status: AC
  Administered 2017-12-26: 20 mg via ORAL

## 2017-12-26 MED ORDER — SUGAMMADEX SODIUM 200 MG/2ML IV SOLN
INTRAVENOUS | Status: DC | PRN
  Administered 2017-12-26: 98.8 mg via INTRAVENOUS

## 2017-12-26 MED ORDER — FENTANYL CITRATE (PF) 100 MCG/2ML IJ SOLN
25.0000 ug | INTRAMUSCULAR | Status: AC | PRN
  Administered 2017-12-26 (×6): 25 ug via INTRAVENOUS

## 2017-12-26 MED ORDER — OXYTOCIN 40 UNITS IN LACTATED RINGERS INFUSION - SIMPLE MED
INTRAVENOUS | Status: DC | PRN
  Administered 2017-12-26: 200 mL via INTRAVENOUS

## 2017-12-26 MED ORDER — DEXAMETHASONE SODIUM PHOSPHATE 10 MG/ML IJ SOLN
INTRAMUSCULAR | Status: DC | PRN
  Administered 2017-12-26: 10 mg via INTRAVENOUS

## 2017-12-26 MED ORDER — SUGAMMADEX SODIUM 200 MG/2ML IV SOLN
INTRAVENOUS | Status: AC
  Filled 2017-12-26: qty 2

## 2017-12-26 SURGICAL SUPPLY — 23 items
ADAPTER VACURETTE TBG SET 14 (CANNULA) IMPLANT
CATH ROBINSON RED A/P 16FR (CATHETERS) ×3 IMPLANT
DRSG TELFA 3X8 NADH (GAUZE/BANDAGES/DRESSINGS) ×3 IMPLANT
FILTER UTR ASPR SPEC (MISCELLANEOUS) ×1 IMPLANT
FLTR UTR ASPR SPEC (MISCELLANEOUS) ×3
GLOVE ORTHO TXT STRL SZ7.5 (GLOVE) ×3 IMPLANT
GOWN STRL REUS W/ TWL LRG LVL3 (GOWN DISPOSABLE) ×1 IMPLANT
GOWN STRL REUS W/TWL LRG LVL3 (GOWN DISPOSABLE) ×2
KIT BERKELEY 1ST TRIMESTER 3/8 (MISCELLANEOUS) ×3 IMPLANT
KIT TURNOVER KIT A (KITS) ×3 IMPLANT
NEEDLE HYPO 25X1 1.5 SAFETY (NEEDLE) ×3 IMPLANT
PACK DNC HYST (MISCELLANEOUS) ×3 IMPLANT
PAD OB MATERNITY 4.3X12.25 (PERSONAL CARE ITEMS) ×3 IMPLANT
PAD PREP 24X41 OB/GYN DISP (PERSONAL CARE ITEMS) ×3 IMPLANT
SET BERKELEY SUCTION TUBING (SUCTIONS) ×3 IMPLANT
SOL PREP PVP 2OZ (MISCELLANEOUS) ×3
SOLUTION PREP PVP 2OZ (MISCELLANEOUS) ×1 IMPLANT
SPONGE XRAY 4X4 16PLY STRL (MISCELLANEOUS) ×3 IMPLANT
VACURETTE 10 RIGID CVD (CANNULA) ×3 IMPLANT
VACURETTE 12 RIGID CVD (CANNULA) IMPLANT
VACURETTE 7MM F TIP (CANNULA)
VACURETTE 7MM F TIP STRL (CANNULA) IMPLANT
VACURETTE 8 RIGID CVD (CANNULA) IMPLANT

## 2017-12-26 NOTE — Op Note (Signed)
    OPERATIVE NOTE 12/26/2017 12:29 PM  PRE-OPERATIVE DIAGNOSIS:  1) MISSED AB  POST-OPERATIVE DIAGNOSIS:  SAME  OPERATION:  D&E  SURGEON(S): Surgeon(s) and Role:    Harlin Heys, MD - Primary   ANESTHESIA: General  ESTIMATED BLOOD LOSS: 82ml  OPERATIVE FINDINGS: uterine contents  SPECIMEN:  ID Type Source Tests Collected by Time Destination  1 : uterine contents Tissue [facility] Other SURGICAL PATHOLOGY Harlin Heys, MD [id number]     COMPLICATIONS: None  DRAINS: Foley to gravity  DISPOSITION: Stable to recovery room  DESCRIPTION OF PROCEDURE:      The patient was prepped and draped in the dorsal lithotomy position and placed under general anesthesia. Her cervix was grasped with a Jacob's tenaculum. Respecting the position and curvature of her cervix, it was dilated to accommodate a number 10 suction curette. The suction curette was placed within the endometrial cavity and a pressure greater than 65 mmHg was allowed to build. A systematic curettage was performed in all quadrants until no additional tissue was noted. The uterus became firm and globular. Pitocin was run in the IV. The tenaculum was removed from the cervix and hemostasis was noted. The weighted speculum was removed and the patient went to recovery room in stable condition.    Finis Bud, M.D. 12/26/2017 12:29 PM

## 2017-12-26 NOTE — Transfer of Care (Signed)
Immediate Anesthesia Transfer of Care Note  Patient: Anna Ferguson  Procedure(s) Performed: DILATATION AND EVACUATION (N/A )  Patient Location: PACU  Anesthesia Type:General  Level of Consciousness: awake and sedated  Airway & Oxygen Therapy: Patient Spontanous Breathing and Patient connected to face mask oxygen  Post-op Assessment: Report given to RN and Post -op Vital signs reviewed and stable  Post vital signs: Reviewed and stable  Last Vitals:  Vitals:   12/26/17 0932  BP: 100/62  Pulse: 81  Resp: 18  Temp: 36.6 C  SpO2: 95%    Last Pain:  Vitals:   12/26/17 0932  TempSrc: Oral  PainSc: 9          Complications: No apparent anesthesia complications

## 2017-12-26 NOTE — Interval H&P Note (Signed)
History and Physical Interval Note:  12/26/2017 11:00 AM  Anna Ferguson  has presented today for surgery, with the diagnosis of MISSED AB  The various methods of treatment have been discussed with the patient and family. After consideration of risks, benefits and other options for treatment, the patient has consented to  Procedure(s): DILATATION AND EVACUATION (N/A) as a surgical intervention .  The patient's history has been reviewed, patient examined, no change in status, stable for surgery.  I have reviewed the patient's chart and labs.  Questions were answered to the patient's satisfaction.     Jeannie Fend

## 2017-12-26 NOTE — Anesthesia Postprocedure Evaluation (Signed)
Anesthesia Post Note  Patient: Anna Ferguson  Procedure(s) Performed: DILATATION AND EVACUATION (N/A )  Patient location during evaluation: PACU Anesthesia Type: General Level of consciousness: awake and alert Pain management: pain level controlled Vital Signs Assessment: post-procedure vital signs reviewed and stable Respiratory status: spontaneous breathing, nonlabored ventilation, respiratory function stable and patient connected to nasal cannula oxygen Cardiovascular status: blood pressure returned to baseline and stable Postop Assessment: no apparent nausea or vomiting Anesthetic complications: no     Last Vitals:  Vitals:   12/26/17 1439 12/26/17 1456  BP: (!) 91/56 102/62  Pulse: 72 66  Resp: 11 16  Temp: 37.1 C 37.3 C  SpO2: 99% 100%    Last Pain:  Vitals:   12/26/17 1456  TempSrc: Temporal  PainSc: [name]

## 2017-12-26 NOTE — Anesthesia Procedure Notes (Signed)
Procedure Name: Intubation Date/Time: 12/26/2017 11:56 AM Performed by: Nelda Marseille, CRNA Pre-anesthesia Checklist: Patient identified, Patient being monitored, Timeout performed, Emergency Drugs available and Suction available Patient Re-evaluated:Patient Re-evaluated prior to induction Oxygen Delivery Method: Circle system utilized Preoxygenation: Pre-oxygenation with 100% oxygen Induction Type: IV induction Ventilation: Mask ventilation without difficulty Laryngoscope Size: Mac and 3 Grade View: Grade I Tube type: Oral Tube size: 7.0 mm Number of attempts: 1 Airway Equipment and Method: Stylet Placement Confirmation: ETT inserted through vocal cords under direct vision,  positive ETCO2 and breath sounds checked- equal and bilateral Secured at: 21 cm Tube secured with: Tape Dental Injury: Teeth and Oropharynx as per pre-operative assessment

## 2017-12-26 NOTE — Discharge Instructions (Signed)

## 2017-12-26 NOTE — Anesthesia Post-op Follow-up Note (Signed)
Anesthesia QCDR form completed.        

## 2017-12-26 NOTE — Anesthesia Preprocedure Evaluation (Addendum)
Anesthesia Evaluation  Patient identified by MRN, date of birth, ID band Patient awake    Reviewed: Allergy & Precautions, NPO status , Patient's Chart, lab work & pertinent test results, reviewed documented beta blocker date and time   Airway Mallampati: II  TM Distance: >3 FB     Dental  (+) Chipped   Pulmonary Current Smoker,           Cardiovascular      Neuro/Psych    GI/Hepatic   Endo/Other    Renal/GU      Musculoskeletal   Abdominal   Peds  Hematology   Anesthesia Other Findings   Reproductive/Obstetrics                             Anesthesia Physical Anesthesia Plan  ASA: III  Anesthesia Plan: General   Post-op Pain Management:    Induction: Intravenous  PONV Risk Score and Plan:   Airway Management Planned: Oral ETT  Additional Equipment:   Intra-op Plan:   Post-operative Plan:   Informed Consent: I have reviewed the patients History and Physical, chart, labs and discussed the procedure including the risks, benefits and alternatives for the proposed anesthesia with the patient or authorized representative who has indicated his/her understanding and acceptance.       Plan Discussed with: CRNA  Anesthesia Plan Comments:         Anesthesia Quick Evaluation  

## 2017-12-27 ENCOUNTER — Encounter: Payer: Self-pay | Admitting: Obstetrics and Gynecology

## 2017-12-29 ENCOUNTER — Telehealth: Payer: Self-pay | Admitting: Obstetrics and Gynecology

## 2017-12-29 LAB — SURGICAL PATHOLOGY

## 2017-12-29 NOTE — Telephone Encounter (Signed)
The patient called and stated that she would like to speak with a nurse as soon as possible in regards to her having an out break, after having her [facility] procedure, No other information was disclosed. Pease advise.

## 2017-12-29 NOTE — Telephone Encounter (Signed)
lmtrc

## 2017-12-30 NOTE — Telephone Encounter (Signed)
Pt states she developed an outbreak after surgery. Pt spoke with on call and received rx for  acyclovir. Pt states she is better.

## 2018-01-09 ENCOUNTER — Encounter: Payer: Medicaid Other | Admitting: Obstetrics and Gynecology

## 2018-01-19 ENCOUNTER — Telehealth: Payer: Self-pay | Admitting: Obstetrics and Gynecology

## 2018-01-19 NOTE — Telephone Encounter (Signed)
Hey are we seeing this pt??

## 2018-01-19 NOTE — Telephone Encounter (Signed)
The patient called and stated that she would like to schedule her post op apt w/ Dr. Amalia Hailey. I would like to speak with a nurse or Dr. Amalia Hailey because I was under the impression that we are to no longer schedule any further appointments with the patient per [facility]. No other information was disclosed. Please advise.

## 2018-01-27 ENCOUNTER — Encounter: Payer: Medicaid Other | Admitting: Obstetrics and Gynecology

## 2018-06-10 ENCOUNTER — Telehealth: Payer: Self-pay

## 2018-06-10 ENCOUNTER — Emergency Department
Admission: EM | Admit: 2018-06-10 | Discharge: 2018-06-10 | Disposition: A | Discharge status: Home / Self Care | Payer: Medicaid Other | Attending: Emergency Medicine | Admitting: Emergency Medicine

## 2018-06-10 ENCOUNTER — Encounter: Payer: Self-pay | Admitting: *Deleted

## 2018-06-10 ENCOUNTER — Emergency Department: Payer: Medicaid Other

## 2018-06-10 ENCOUNTER — Other Ambulatory Visit: Payer: Self-pay

## 2018-06-10 DIAGNOSIS — O99331 Smoking (tobacco) complicating pregnancy, first trimester: Secondary | ICD-10-CM | POA: Insufficient documentation

## 2018-06-10 DIAGNOSIS — Z3A01 Less than 8 weeks gestation of pregnancy: Secondary | ICD-10-CM | POA: Diagnosis not present

## 2018-06-10 DIAGNOSIS — O209 Hemorrhage in early pregnancy, unspecified: Secondary | ICD-10-CM | POA: Diagnosis present

## 2018-06-10 DIAGNOSIS — F17210 Nicotine dependence, cigarettes, uncomplicated: Secondary | ICD-10-CM | POA: Insufficient documentation

## 2018-06-10 DIAGNOSIS — O200 Threatened abortion: Secondary | ICD-10-CM

## 2018-06-10 DIAGNOSIS — O4690 Antepartum hemorrhage, unspecified, unspecified trimester: Secondary | ICD-10-CM

## 2018-06-10 LAB — HCG, QUANTITATIVE, PREGNANCY: HCG, BETA CHAIN, QUANT, S: 17323 m[IU]/mL — AB (ref <5)

## 2018-06-10 NOTE — ED Notes (Signed)
ED Provider at bedside. 

## 2018-06-10 NOTE — Telephone Encounter (Signed)
Call transferred from front desk  Pt informed per DJE she is being dismissed from the practice. She is to expect a certified letter with in 7-10 business days. She is aware we will see her for emergent issues only for the next 30 days. She states she is high risk and needs to be seen. She is approx [redacted] weeks pregnant.  She is having a very light pink tinge on her pad. She is changing  only once a day if that. I informed pt that was not a an emergent issue. If was very common to spot in early pregnancy. Pt informed that d/t no shows and a conflict at [facility] with [facility]E and her significant other during her surgery  she is being dismissed at this time. Pt was not happy about this. She proceed to curse at me and states she will contact her lawyer.  She abruptly hung up the phone.

## 2018-06-10 NOTE — ED Notes (Signed)
Patient transported to [facility] 

## 2018-06-10 NOTE — ED Triage Notes (Signed)
pt reports intermittent spotting with cramping since yesterday. Pt reports she is [redacted] weeks pregnant. [facility] told pt to come to ED for an [place].Pt reports having had a miscarriage 5 months ago. 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

## 2018-06-10 NOTE — ED Provider Notes (Signed)
[facility] Emergency Department Provider Note  ____________________________________________   First MD Initiated Contact with Patient 06/10/18 1727     (approximate)  I have reviewed the triage vital signs and the nursing notes.   HISTORY  Chief Complaint Vaginal Bleeding    HPI Anna Ferguson is a 32 y.o. female G9 P2 Ab 6 who is again pregnant with a last menstrual period less than 2 months ago.  She presents for evaluation of vaginal spotting yesterday which has stopped today.  The bleeding was mild, acute in onset, and accompanied with some lower abdominal cramping.  Nothing in particular made it better or worse.  She is establishing care at [facility] and call them and was told to come to the ED for an ultrasound.  She denies any recent fever/chills, chest pain, shortness of breath, nausea, vomiting, and abdominal pain other than the mild intermittent cramping.  She says that the bleeding looks like a pink color on the toilet paper when she wiped.  Medical history as listed below including Suboxone use and tobacco use.  Multiple prior miscarriages.  Past Medical History:  Diagnosis Date  . Miscarriage     There are no active problems to display for this patient.   Past Surgical History:  Procedure Laterality Date  . DILATION AND CURETTAGE OF UTERUS    . DILATION AND EVACUATION N/A 12/26/2017   Procedure: DILATATION AND EVACUATION;  Surgeon: Harlin Heys, MD;  Location: [facility] ORS;  Service: Gynecology;  Laterality: N/A;    Prior to Admission medications   Medication Sig Start Date End Date Taking? Authorizing Provider  Buprenorphine HCl-Naloxone HCl (SUBOXONE) 8-2 MG FILM Place 2 each under the tongue 2 (two) times daily.     [provider]  diphenhydrAMINE (BENADRYL) 25 mg capsule Take 25 mg by mouth at bedtime. Sleep    [provider]    Allergies Gabapentin  Family History  Problem Relation Age  of Onset  . Cancer Neg Hx   . Diabetes Neg Hx   . Stroke Neg Hx   . Heart disease Neg Hx     Social History Social History   Tobacco Use  . Smoking status: Current Every Day Smoker    Packs/day: 0.50    Types: Cigarettes  . Smokeless tobacco: Never Used  Substance Use Topics  . Alcohol use: No    Frequency: Never  . Drug use: No    Review of Systems Constitutional: No fever/chills Eyes: No visual changes. ENT: No sore throat. Cardiovascular: Denies chest pain. Respiratory: Denies shortness of breath. Gastrointestinal: No abdominal pain.  No nausea, no vomiting.  No diarrhea.  No constipation. Genitourinary: Vaginal spotting as described above. Musculoskeletal: Negative for neck pain.  Negative for back pain. Integumentary: Negative for rash. Neurological: Negative for headaches, focal weakness or numbness.   ____________________________________________   PHYSICAL EXAM:  VITAL SIGNS: ED Triage Vitals  Enc Vitals Group     BP 06/10/18 1621 (!) 92/52     Pulse Rate 06/10/18 1621 72     Resp --      Temp 06/10/18 1621 98.1 F (36.7 C)     Temp Source 06/10/18 1621 Oral     SpO2 06/10/18 1621 100 %     Weight 06/10/18 1621 48.5 kg (107 lb)     Height 06/10/18 1621 1.753 m (5\' 9" )     Head Circumference --      Peak Flow --  Pain Score 06/10/18 1627 4     Pain Loc --      Pain Edu? --      Excl. in [place]? --     Constitutional: Alert and oriented. Very thin body habitus. Eyes: Conjunctivae are normal.  Head: Atraumatic. Nose: No congestion/rhinnorhea. Mouth/Throat: Mucous membranes are moist.  Neck: No stridor.  No meningeal signs.   Cardiovascular: Normal rate, regular rhythm. Good peripheral circulation. Grossly normal heart sounds. Respiratory: Normal respiratory effort.  No retractions. Lungs CTAB. Gastrointestinal: Soft and nontender. No distention.  Genitourinary: Deferred at the joint decision between the patient and myself Musculoskeletal: No  lower extremity tenderness nor edema. No gross deformities of extremities. Neurologic:  Normal speech and language. No gross focal neurologic deficits are appreciated.  Skin:  Skin is warm, dry and intact. No rash noted. Psychiatric: Mood and affect are somewhat irritated or irritable, but perhaps understandable given her history of spontaneous abortions.  This is a desired pregnancy.  ____________________________________________   LABS (all labs ordered are listed, but only abnormal results are displayed)  Labs Reviewed  HCG, QUANTITATIVE, PREGNANCY - Abnormal; Notable for the following components:      Result Value   hCG, Beta Chain, Quant, S 17,323 (*)    All other components within normal limits  URINALYSIS, ROUTINE W REFLEX MICROSCOPIC   ____________________________________________  EKG  None - EKG not ordered by ED physician ____________________________________________  RADIOLOGY   ED MD interpretation:  Yolk sac at about 5 weeks 6 days, no fetal activity visualized  Official radiology report(s): US Ob Comp < 14 Wks  Result Date: 06/10/2018 CLINICAL DATA:  Pregnant, cramping/spotting EXAM: OBSTETRIC <14 WK [place] AND TRANSVAGINAL OB US TECHNIQUE: Both transabdominal and transvaginal ultrasound examinations were performed for complete evaluation of the gestation as well as the maternal uterus, adnexal regions, and pelvic cul-de-sac. Transvaginal technique was performed to assess early pregnancy. COMPARISON:  None. FINDINGS: Intrauterine gestational sac: Single Yolk sac:  Visualized. Embryo:  Not Visualized. MSD: 11.1 mm   5 w   6 d Subchorionic hemorrhage:  None visualized. Maternal uterus/adnexae: Right ovary is within normal limits. Left ovary is notable for a corpus luteal cyst. Trace pelvic fluid. IMPRESSION: Single intrauterine gestation with yolk sac, measuring 5 weeks 6 days by mean sac diameter. No fetal pole is visualized. Consider follow-up pelvic ultrasound in 14 days to  confirm viability, as clinically warranted. Electronically Signed   By: Julian Hy M.D.   On: 06/10/2018 19:11   US Ob Transvaginal  Result Date: 06/10/2018 CLINICAL DATA:  Pregnant, cramping/spotting EXAM: OBSTETRIC <14 WK [place] AND TRANSVAGINAL OB US TECHNIQUE: Both transabdominal and transvaginal ultrasound examinations were performed for complete evaluation of the gestation as well as the maternal uterus, adnexal regions, and pelvic cul-de-sac. Transvaginal technique was performed to assess early pregnancy. COMPARISON:  None. FINDINGS: Intrauterine gestational sac: Single Yolk sac:  Visualized. Embryo:  Not Visualized. MSD: 11.1 mm   5 w   6 d Subchorionic hemorrhage:  None visualized. Maternal uterus/adnexae: Right ovary is within normal limits. Left ovary is notable for a corpus luteal cyst. Trace pelvic fluid. IMPRESSION: Single intrauterine gestation with yolk sac, measuring 5 weeks 6 days by mean sac diameter. No fetal pole is visualized. Consider follow-up pelvic ultrasound in 14 days to confirm viability, as clinically warranted. Electronically Signed   By: Julian Hy M.D.   On: 06/10/2018 19:11    ____________________________________________   PROCEDURES  Critical Care performed: No   Procedure(s) performed:  Procedures   ____________________________________________   INITIAL IMPRESSION / ASSESSMENT AND PLAN / ED COURSE  As part of my medical decision making, I reviewed the following data within the [place] notes reviewed and incorporated, Labs reviewed , Old chart reviewed and Notes from prior ED visits    Differential diagnosis includes, but is not limited to, threatened miscarriage, incomplete miscarriage, normal bleeding from an early trimester pregnancy, ectopic pregnancy, , blighted ovum, vaginal/cervical trauma, subchorionic hemorrhage/hematoma, etc.  The patient has had numerous prior spontaneous abortions.  I reviewed the  medical record and it looks like her last loss must have been about 6 months ago.  She did not disclose this information, but I saw in the medical record a phone conversation today between 1 of the assistants at [facility] and the patient where the patient was informed that she was dismissed from their practice due to some negative interactions between the patient's husband and with the patient and Dr. Amalia Hailey.  That seems to be why she is establishing care with [facility].  The patient is in no distress and is having no bleeding today.  Ultrasound is pending and I explained to the patient it is very likely that it would be too early to tell her definitively if she is losing the baby or not or if in fact there is even an intrauterine pregnancy.  She is very frustrated.  I will discuss it further with her and counseled her the best I can plan we get the ultrasound results.  In the medical record from a few months ago as well I verify that she is Rh+ and therefore does not require RhoGam. Clinical Course as of Jun 11 1923  Wed Jun 10, 2018  1920 Intrauterine yolk sac visualized and measured at about 5 weeks and 6 days, but no fetal or cardiac activity can be visualized.  I advised the patient of these results and explained to the best of my ability what it means in terms of the need for close outpatient follow-up including repeat hCG testing and a repeat ultrasound at least in 14 days.  She says she will follow-up with [facility].  I discussed performing a pelvic exam but I explained that I did not think it would be particularly enlightening or contributory to the work-up and disposition today, and she understands and does not want to proceed.  She states that she is not worried about STD.  She also does not want to give a urine specimen because she says that she was just tested last week by her PCP and it was negative.I gave my usual and customary return precautions and she understands and agrees with the plan.   US OB Comp < 14 Wks [CF]    Clinical Course User Index [CF] Hinda Kehr, MD    ____________________________________________  FINAL CLINICAL IMPRESSION(S) / ED DIAGNOSES  Final diagnoses:  Vaginal bleeding in pregnancy  Threatened miscarriage     MEDICATIONS GIVEN DURING THIS VISIT:  Medications - No data to display   ED Discharge Orders    None       Note:  This document was prepared using Dragon voice recognition software and may include unintentional dictation errors.    Hinda Kehr, MD 06/10/18 1924

## 2018-06-10 NOTE — ED Notes (Signed)
Patient walked out of facility prior to discharge papers and instructions.  Patient ambulatory at that time.

## 2018-06-10 NOTE — Discharge Instructions (Signed)
You have been seen in the Emergency Department (ED) for vaginal bleeding during pregnancy, which is called a ?threatened abortion?Marland Kitchen  Your ultrasound demonstrated a yolk sac within the uterus, but it appears to be too early to see a fetus, or whether or not there is a heartbeat.  We recommend that you call [facility] to set up a follow-up appointment to have repeat lab work drawn (beta hCG blood pregnancy test) to see if it is going up and value as anticipated, and for you to have a repeat ultrasound in about 2 weeks.  Please start taking prenatal vitamins (over the counter) if you are not already doing so.  As a result of your blood type, you did not receive an injection of medication called Rhogam - please let your OB/Gyn know.  Please follow up as recommended above.  If you develop any other symptoms that concern you (including, but not limited to, persistent vomiting, worsening bleeding, abdominal or pelvic pain, or fever greater than 101), please return immediately to the Emergency Department.

## 2018-08-10 ENCOUNTER — Inpatient Hospital Stay
Admission: EM | Admit: 2018-08-10 | Discharge: 2018-08-15 | DRG: 770 | Disposition: A | Discharge status: Home / Self Care | Payer: Medicaid Other | Attending: Obstetrics and Gynecology | Admitting: Obstetrics and Gynecology

## 2018-08-10 ENCOUNTER — Emergency Department: Payer: Medicaid Other

## 2018-08-10 ENCOUNTER — Other Ambulatory Visit: Payer: Self-pay

## 2018-08-10 DIAGNOSIS — N83201 Unspecified ovarian cyst, right side: Secondary | ICD-10-CM | POA: Diagnosis present

## 2018-08-10 DIAGNOSIS — O034 Incomplete spontaneous abortion without complication: Principal | ICD-10-CM | POA: Diagnosis present

## 2018-08-10 DIAGNOSIS — F17210 Nicotine dependence, cigarettes, uncomplicated: Secondary | ICD-10-CM | POA: Diagnosis present

## 2018-08-10 DIAGNOSIS — Z302 Encounter for sterilization: Secondary | ICD-10-CM

## 2018-08-10 DIAGNOSIS — O039 Complete or unspecified spontaneous abortion without complication: Secondary | ICD-10-CM

## 2018-08-10 DIAGNOSIS — D649 Anemia, unspecified: Secondary | ICD-10-CM

## 2018-08-10 DIAGNOSIS — D62 Acute posthemorrhagic anemia: Secondary | ICD-10-CM | POA: Diagnosis present

## 2018-08-10 HISTORY — DX: Scoliosis, unspecified: M41.9

## 2018-08-10 HISTORY — DX: Malignant (primary) neoplasm, unspecified: C80.1

## 2018-08-10 LAB — CBC
HCT: 20.6 % — ABNORMAL LOW (ref 35.0–47.0)
Hemoglobin: 6.5 g/dL — ABNORMAL LOW (ref 12.0–16.0)
MCH: 22.8 pg — AB (ref 26.0–34.0)
MCHC: 31.3 g/dL — AB (ref 32.0–36.0)
MCV: 72.9 fL — ABNORMAL LOW (ref 80.0–100.0)
PLATELETS: 353 10*3/uL (ref 150–440)
RBC: 2.83 MIL/uL — ABNORMAL LOW (ref 3.80–5.20)
RDW: 26.8 % — ABNORMAL HIGH (ref 11.5–14.5)
WBC: 5.7 10*3/uL (ref 3.6–11.0)

## 2018-08-10 LAB — PREPARE RBC (CROSSMATCH)

## 2018-08-10 LAB — PREGNANCY, URINE: Preg Test, Ur: POSITIVE — AB

## 2018-08-10 LAB — HCG, QUANTITATIVE, PREGNANCY: hCG, Beta Chain, Quant, S: 178 m[IU]/mL — ABNORMAL HIGH (ref <5)

## 2018-08-10 MED ORDER — SODIUM CHLORIDE 0.9% IV SOLUTION
Freq: Once | INTRAVENOUS | Status: AC
  Administered 2018-08-10: 23:00:00 via INTRAVENOUS
  Filled 2018-08-10: qty 250

## 2018-08-10 MED ORDER — PRENATAL MULTIVITAMIN CH
1.0000 | ORAL_TABLET | Freq: Every day | ORAL | Status: DC
  Filled 2018-08-10 (×2): qty 1

## 2018-08-10 MED ORDER — FENTANYL CITRATE (PF) 100 MCG/2ML IJ SOLN
50.0000 ug | Freq: Once | INTRAMUSCULAR | Status: AC
  Administered 2018-08-10: 50 ug via INTRAVENOUS
  Filled 2018-08-10: qty 2

## 2018-08-10 MED ORDER — SODIUM CHLORIDE 0.9 % IV SOLN
10.0000 mL/h | Freq: Once | INTRAVENOUS | Status: AC
  Administered 2018-08-11: 10 mL/h via INTRAVENOUS

## 2018-08-10 MED ORDER — INFLUENZA VAC SPLIT QUAD 0.5 ML IM SUSY: 0.5000 mL | PREFILLED_SYRINGE | INTRAMUSCULAR | Status: DC

## 2018-08-10 NOTE — ED Triage Notes (Signed)
Pt to the er for passing possible products of conception. Pt had a miscarriage 3 weeks ago and pt called obgyn at [facility] and stated to do the miscarriage at home. Pt states that [facility] refuses to follow up with her.

## 2018-08-10 NOTE — Progress Notes (Signed)
Patient presented to ED with 3 weeks of VB following a miscarriage. Ultrasound showed thickened endometrium, 4 cm hypoechogenic ovarian cyst, and free fluid in pelvis  Hemoglobin found to be 6.5   She will be admitted for blood transfusion and surgery to follow.  Likely D&E with possible laparoscopy, TBD.  Formal H&P to follow; pt to 3rd floor for PRBCs  ----- Larey Days, MD Attending Obstetrician and Gynecologist [facility], Department of [facility]

## 2018-08-10 NOTE — ED Provider Notes (Signed)
[facility] Emergency Department Provider Note   ____________________________________________   I have reviewed the triage vital signs and the nursing notes.   HISTORY  Chief Complaint Miscarriage   History limited by: Not Limited   HPI Anna Ferguson is a 32 y.o. female who presents to the emergency department today because of concerns for continued bleeding and pelvic pain after miscarriage.  Patient states she had a miscarriage roughly 3 weeks ago.  She states that since that time she is continued to have some bleeding and pelvic pain.  She is also felt like she has been passing some tissue.  She states that she has had multiple miscarriages in the past and is required D&Cs in the past.    Per medical record review patient has a history of miscarriage, dilatation curettage  Past Medical History:  Diagnosis Date  . Cancer ([place])   . Miscarriage   . Scoliosis     There are no active problems to display for this patient.   Past Surgical History:  Procedure Laterality Date  . DILATION AND CURETTAGE OF UTERUS    . DILATION AND EVACUATION N/A 12/26/2017   Procedure: DILATATION AND EVACUATION;  Surgeon: Harlin Heys, MD;  Location: [facility] ORS;  Service: Gynecology;  Laterality: N/A;  . TONSILLECTOMY      Prior to Admission medications   Medication Sig Start Date End Date Taking? Authorizing Provider  Buprenorphine HCl-Naloxone HCl (SUBOXONE) 8-2 MG FILM Place 2 each under the tongue 2 (two) times daily.     [provider]  diphenhydrAMINE (BENADRYL) 25 mg capsule Take 25 mg by mouth at bedtime. Sleep    [provider]    Allergies Gabapentin  Family History  Problem Relation Age of Onset  . Cancer Neg Hx   . Diabetes Neg Hx   . Stroke Neg Hx   . Heart disease Neg Hx     Social History Social History   Tobacco Use  . Smoking status: Current Every Day Smoker    Packs/day: 0.50    Types: Cigarettes  .  Smokeless tobacco: Never Used  Substance Use Topics  . Alcohol use: No    Frequency: Never  . Drug use: No    Review of Systems Constitutional: No fever/chills Eyes: No visual changes. ENT: No sore throat. Cardiovascular: Denies chest pain. Respiratory: Denies shortness of breath. Gastrointestinal: Positive for suprapubic pain Genitourinary: Positive for vaginal bleeding Musculoskeletal: Negative for back pain. Skin: Negative for rash. Neurological: Negative for headaches, focal weakness or numbness.  ____________________________________________   PHYSICAL EXAM:  VITAL SIGNS: ED Triage Vitals  Enc Vitals Group     BP 08/10/18 1646 (!) 94/37     Pulse Rate 08/10/18 1646 82     Resp 08/10/18 1646 16     Temp 08/10/18 1646 98.2 F (36.8 C)     Temp Source 08/10/18 1646 Oral     SpO2 08/10/18 1646 100 %     Weight 08/10/18 1648 107 lb (48.5 kg)     Height 08/10/18 1648 5\' 8"  (1.727 m)     Head Circumference --      Peak Flow --      Pain Score 08/10/18 1647 8   Constitutional: Alert and oriented.  Eyes: Conjunctivae are normal.  ENT      Head: Normocephalic and atraumatic.      Nose: No congestion/rhinnorhea.      Mouth/Throat: Mucous membranes are moist.  Neck: No stridor. Hematological/Lymphatic/Immunilogical: No cervical lymphadenopathy. Cardiovascular: Normal rate, regular rhythm.  No murmurs, rubs, or gallops.  Respiratory: Normal respiratory effort without tachypnea nor retractions. Breath sounds are clear and equal bilaterally. No wheezes/rales/rhonchi. Gastrointestinal: Soft and non tender. No rebound. No guarding.  Genitourinary: Deferred Musculoskeletal: Normal range of motion in all extremities. No lower extremity edema. Neurologic:  Normal speech and language. No gross focal neurologic deficits are appreciated.  Skin:  Skin is warm, dry and intact. No rash noted. Psychiatric: Mood and affect are normal. Speech and behavior are normal. Patient  exhibits appropriate insight and judgment.  ____________________________________________    LABS (pertinent positives/negatives)  Upreg positive hcg 178 Upreg positive CBC wbc 5.7, hgb 6.5, plt 353 ____________________________________________   EKG  None  ____________________________________________    RADIOLOGY  Pelvic US Concerning for retained products of conception  ____________________________________________   PROCEDURES  Procedures  CRITICAL CARE Performed by: Nance Pear   Total critical care time: 40 minutes  Critical care time was exclusive of separately billable procedures and treating other patients.  Critical care was necessary to treat or prevent imminent or life-threatening deterioration.  Critical care was time spent personally by me on the following activities: development of treatment plan with patient and/or surrogate as well as nursing, discussions with consultants, evaluation of patient's response to treatment, examination of patient, obtaining history from patient or surrogate, ordering and performing treatments and interventions, ordering and review of laboratory studies, ordering and review of radiographic studies, pulse oximetry and re-evaluation of patient's condition.  ____________________________________________   INITIAL IMPRESSION / ASSESSMENT AND PLAN / ED COURSE  Pertinent labs & imaging results that were available during my care of the patient were reviewed by me and considered in my medical decision making (see chart for details).   Patient presented to the emergency department today because of concerns for continued vaginal bleeding and cramping after miscarriage roughly 3 weeks ago.  On exam patient appears mildly uncomfortable.  Mild tenderness palpation suprapubic region.  Ultrasound is concerning for retained products of conception.  Hemoglobin was low at 6.5.  Discussed with Dr. Leonides Schanz with OB/GYN.  Patient's blood pressure  was low here however per chart review she has chronic low blood pressure.  Will plan on transfusion and admission.   ____________________________________________   FINAL CLINICAL IMPRESSION(S) / ED DIAGNOSES  Final diagnoses:  Anemia, unspecified type  Retained products of conception after miscarriage     Note: This dictation was prepared with Dragon dictation. Any transcriptional errors that result from this process are unintentional     Nance Pear, MD 08/10/18 2230

## 2018-08-10 NOTE — ED Notes (Signed)
Patient transported to Ultrasound 

## 2018-08-11 LAB — URINE DRUG SCREEN, QUALITATIVE (ARMC ONLY)
Amphetamines, Ur Screen: NOT DETECTED
BARBITURATES, UR SCREEN: NOT DETECTED
BENZODIAZEPINE, UR SCRN: NOT DETECTED
CANNABINOID 50 NG, UR ~~LOC~~: NOT DETECTED
Cocaine Metabolite,Ur ~~LOC~~: POSITIVE — AB
MDMA (Ecstasy)Ur Screen: NOT DETECTED
Methadone Scn, Ur: NOT DETECTED
Opiate, Ur Screen: NOT DETECTED
PHENCYCLIDINE (PCP) UR S: NOT DETECTED
TRICYCLIC, UR SCREEN: NOT DETECTED

## 2018-08-11 LAB — HEMOGLOBIN AND HEMATOCRIT, BLOOD
HEMATOCRIT: 21.6 % — AB (ref 35.0–47.0)
HEMATOCRIT: 29.6 % — AB (ref 35.0–47.0)
HEMOGLOBIN: 7 g/dL — AB (ref 12.0–16.0)
HEMOGLOBIN: 10 g/dL — AB (ref 12.0–16.0)

## 2018-08-11 LAB — PREPARE RBC (CROSSMATCH)

## 2018-08-11 MED ORDER — ACETAMINOPHEN 500 MG PO TABS
1000.0000 mg | ORAL_TABLET | Freq: Four times a day (QID) | ORAL | Status: DC | PRN
  Administered 2018-08-11 – 2018-08-14 (×7): 1000 mg via ORAL
  Filled 2018-08-11 (×7): qty 2

## 2018-08-11 MED ORDER — LACTATED RINGERS IV BOLUS
1000.0000 mL | Freq: Once | INTRAVENOUS | Status: AC
  Administered 2018-08-11: 1000 mL via INTRAVENOUS

## 2018-08-11 MED ORDER — LACTATED RINGERS IV BOLUS: 1000.0000 mL | Freq: Once | INTRAVENOUS | Status: DC

## 2018-08-11 MED ORDER — LACTATED RINGERS IV BOLUS
500.0000 mL | Freq: Once | INTRAVENOUS | Status: AC
  Administered 2018-08-11: 500 mL via INTRAVENOUS

## 2018-08-11 MED ORDER — KETOROLAC TROMETHAMINE 30 MG/ML IJ SOLN
15.0000 mg | Freq: Four times a day (QID) | INTRAMUSCULAR | Status: DC
  Administered 2018-08-11: 15 mg via INTRAVENOUS
  Filled 2018-08-11 (×3): qty 1

## 2018-08-11 MED ORDER — RANITIDINE HCL 150 MG PO TABS
150.0000 mg | ORAL_TABLET | Freq: Two times a day (BID) | ORAL | Status: DC
  Administered 2018-08-12 – 2018-08-14 (×6): 150 mg via ORAL
  Filled 2018-08-11 (×6): qty 1

## 2018-08-11 MED ORDER — KETOROLAC TROMETHAMINE 30 MG/ML IJ SOLN
15.0000 mg | Freq: Four times a day (QID) | INTRAMUSCULAR | Status: DC
  Administered 2018-08-11 – 2018-08-14 (×11): 15 mg via INTRAVENOUS
  Filled 2018-08-11 (×11): qty 1

## 2018-08-11 MED ORDER — SODIUM CHLORIDE 0.9% IV SOLUTION
Freq: Once | INTRAVENOUS | Status: AC
  Administered 2018-08-11: 11:00:00 via INTRAVENOUS

## 2018-08-11 NOTE — Progress Notes (Signed)
Lab unable to get blood return on blood draw.  Will give 1L bolus LR and try again.   Patient states this is a typical problem for her with blood collection.

## 2018-08-11 NOTE — H&P (Signed)
Consult History and Physical   Late entry from overnight admission   SERVICE: Gynecology   Patient Name: Anna Ferguson Patient MRN:   [id number]  CC: vaginal bleeding  HPI: Anna Ferguson is a 32 y.o. [id number] with pregnancy dated by [redacted]w[redacted]d ultrasound on 06/10/18 who says she miscarried 3 weeks ago but never stopped bleeding/spotting, and worsened yesterday with more cramping pelvic pain.   Ultrasound performed showed thickened endometrium with blood flow.  Also showed a 5cm right ovarian cyst with internal echos and some free fluid around the right ovary. She is spotting, no heavy bleeding from vagina.  She also was found to have a hemoglobin of 6.5 Beta HCG is 172   PMH: underweight, polysubstance addiction (IV heroin use, recently weaned off suboxone),     Review of Systems: positives in bold GEN:   fevers, chills, weight changes, appetite changes, fatigue, night sweats HEENT:  HA, vision changes, hearing loss, congestion, rhinorrhea, sinus pressure, dysphagia CV:   CP, palpitations PULM:  SOB, cough GI:  abd pain, N/V/D/C GU:  dysuria, urgency, frequency MSK:  arthralgias, myalgias, back pain, swelling SKIN:  rashes, color changes, pallor NEURO:  numbness, weakness, tingling, seizures, dizziness, tremors PSYCH:  depression, anxiety, behavioral problems, confusion  HEME/LYMPH:  easy bruising or bleeding ENDO:  heat/cold intolerance GYN: pelvic pain, vaginal bleeeing  Past Obstetrical History: OB History    Gravida  6   Para  2   Term  2   Preterm      AB  4   Living  2     SAB  2   TAB      Ectopic      Multiple      Live Births  2           Past Gynecologic History: Patient's last menstrual period is unknown.  She is dated by a 5wk6d ultrasound  2 miscarriages, 2 live births SVD @ 41wks each, then a miscarriage earlier this year needing D&C.  Past Medical History: Past Medical History:  Diagnosis Date  . Cancer  ([place])   . Miscarriage   . Scoliosis     Past Surgical History:   Past Surgical History:  Procedure Laterality Date  . DILATION AND CURETTAGE OF UTERUS    . DILATION AND EVACUATION N/A 12/26/2017   Procedure: DILATATION AND EVACUATION;  Surgeon: Harlin Heys, MD;  Location: [facility] ORS;  Service: Gynecology;  Laterality: N/A;  . TONSILLECTOMY      Family History:  family history is not on file.  Social History:  Social History   Socioeconomic History  . Marital status: Single    Spouse name: Not on file  . Number of children: Not on file  . Years of education: Not on file  . Highest education level: Not on file  Occupational History  . Not on file  Social Needs  . Financial resource strain: Not on file  . Food insecurity:    Worry: Not on file    Inability: Not on file  . Transportation needs:    Medical: Not on file    Non-medical: Not on file  Tobacco Use  . Smoking status: Current Every Day Smoker    Packs/day: 0.50    Types: Cigarettes  . Smokeless tobacco: Never Used  Substance and Sexual Activity  . Alcohol use: No    Frequency: Never  . Drug use: No  . Sexual activity: Yes    Birth control/protection: None  Lifestyle  . Physical activity:    Days per week: Not on file    Minutes per session: Not on file  . Stress: Not on file  Relationships  . Social connections:    Talks on phone: Not on file    Gets together: Not on file    Attends religious service: Not on file    Active member of club or organization: Not on file    Attends meetings of clubs or organizations: Not on file    Relationship status: Not on file  . Intimate partner violence:    Fear of current or ex partner: Not on file    Emotionally abused: Not on file    Physically abused: Not on file    Forced sexual activity: Not on file  Other Topics Concern  . Not on file  Social History Narrative  . Not on file    Home Medications:  Medications reconciled in EPIC  No current  facility-administered medications on file prior to encounter.    Current Outpatient Medications on File Prior to Encounter  Medication Sig Dispense Refill  . buprenorphine (SUBUTEX) 8 MG SUBL SL tablet DISSOLVE 1 TABLET UNDER THE TONGUE 2 TIMES DAILY  0  . ranitidine (ZANTAC) 150 MG capsule Take 150 mg by mouth 2 (two) times daily.  5  . Buprenorphine HCl-Naloxone HCl (SUBOXONE) 8-2 MG FILM Place 2 each under the tongue 2 (two) times daily.     . butalbital-acetaminophen-caffeine (FIORICET, ESGIC) 50-325-40 MG tablet Take 1 tablet by mouth 3 (three) times daily as needed.  5    Allergies:  Allergies  Allergen Reactions  . Gabapentin Other (See Comments)    seizures    Physical Exam:  Temp:  [98.2 F (36.8 C)-99.3 F (37.4 C)] 99.3 F (37.4 C) (09/17 0315) Pulse Rate:  [75-93] 75 (09/17 0315) Resp:  [16-22] 18 (09/17 0315) BP: (93-100)/(37-64) 98/58 (09/17 0315) SpO2:  [98 %-100 %] 98 % (09/17 0315) Weight:  [48.5 kg] 48.5 kg (09/16 1648)   General Appearance:  Well developed, well nourished, no acute distress, alert and oriented, cooperative and appears stated age 1:  Normocephalic atraumatic, extraocular movements intact, moist mucous membranes, neck supple with midline trachea and thyroid without masses Cardiovascular:  Normal S1/S2, regular rate and rhythm, no murmurs, 2+ distal pulses Pulmonary:  clear to auscultation, no wheezes, rales or rhonchi, symmetric air entry, good air exchange Abdomen:  Bowel sounds present, soft, nontender, nondistended, no abnormal masses or organomegaly, no epigastric pain Back: inspection of back is normal Extremities:  extremities normal, no tenderness, atraumatic, no cyanosis or edema Skin:  normal coloration and turgor, no rashes, no suspicious skin lesions noted  Neurologic:  Cranial nerves 2-12 grossly intact, grossly equal strength and muscle tone, normal speech, no focal findings or movement disorder noted. Psychiatric:  Normal mood  and affect, appropriate, no AH/VH Pelvic:  Deferred, will do in OR.    Labs/Studies:   Results for orders placed or performed during the hospital encounter of 08/10/18 (from the past 24 hour(s))  Pregnancy, urine     Status: Abnormal   Collection Time: 08/10/18  6:49 PM  Result Value Ref Range   Preg Test, Ur POSITIVE (A) NEGATIVE  hCG, quantitative, pregnancy     Status: Abnormal   Collection Time: 08/10/18  9:25 PM  Result Value Ref Range   hCG, Beta Chain, Quant, S 178 (H) <5 mIU/mL  CBC     Status: Abnormal   Collection Time: 08/10/18  9:25 PM  Result Value Ref Range   WBC 5.7 3.6 - 11.0 K/uL   RBC 2.83 (L) 3.80 - 5.20 MIL/uL   Hemoglobin 6.5 (L) 12.0 - 16.0 g/dL   HCT 20.6 (L) 35.0 - 47.0 %   MCV 72.9 (L) 80.0 - 100.0 fL   MCH 22.8 (L) 26.0 - 34.0 pg   MCHC 31.3 (L) 32.0 - 36.0 g/dL   RDW 26.8 (H) 11.5 - 14.5 %   Platelets 353 150 - 440 K/uL  Prepare RBC     Status: None   Collection Time: 08/10/18 10:02 PM  Result Value Ref Range   Order Confirmation      DUPLICATE Performed at [facility], [address] [place], [place] [address]   Type and screen [place]     Status: None (Preliminary result)   Collection Time: 08/10/18 10:06 PM  Result Value Ref Range   ABO/RH(D) A POS    Antibody Screen NEG    Sample Expiration 08/13/2018    Unit Number [id number]    Blood Component Type RED CELLS,LR    Unit division 00    Status of Unit ISSUED    Transfusion Status OK TO TRANSFUSE    Crossmatch Result Compatible    Unit Number [id number]    Blood Component Type RED CELLS,LR    Unit division 00    Status of Unit ISSUED,FINAL    Transfusion Status OK TO TRANSFUSE    Crossmatch Result      Compatible Performed at [facility], [address] [place], [place] [address]   Prepare RBC     Status: None   Collection Time: 08/10/18 10:13 PM  Result Value Ref Range   Order Confirmation      ORDER PROCESSED BY BLOOD  BANK Performed at [facility], [address], [place], [place] [address]      TVUS:   US Ob Comp Less 14 Wks  Result Date: 08/10/2018 CLINICAL DATA:  32 year old female reports miscarriage 3 weeks ago, but still with heavy bleeding. EXAM: OBSTETRIC <14 WK [place] AND TRANSVAGINAL OB US DOPPLER ULTRASOUND OF OVARIES TECHNIQUE: Both transabdominal and transvaginal ultrasound examinations were performed for complete evaluation of the gestation as well as the maternal uterus, adnexal regions, and pelvic cul-de-sac. Transvaginal technique was performed to assess early pregnancy. Color and duplex Doppler ultrasound was utilized to evaluate blood flow to the ovaries. COMPARISON:  Early Ob ultrasound 06/10/2018 FINDINGS: Intrauterine gestational sac: None Yolk sac:  None Embryo:  None Cardiac Activity: None Subchorionic hemorrhage:  Not applicable Maternal uterus/adnexae: Thickened, heterogeneous, and hyper vascular endometrium (image 21) measuring up to 2.7 centimeters in thickness. The left ovary measures 2.9 x 2.2 x 2.7 centimeters and appears within normal limits. Preserved low resistance arterial and venous waveforms. The right ovary is enlarged measuring up to 5.7 x 3.5 x 4.3 centimeters and contains both a simple appearing cystic area measuring up to 3 centimeters and a complex 3.9 centimeter hypoechoic area with a reticular pattern of internal echoes. Neither of these demonstrates internal vascularity (image 53), but there are preserved low resistance arterial and venous waveforms elsewhere within the right ovary. There is a small amount of simple appearing free fluid adjacent to the left ovary. IMPRESSION: 1. Thickened, heterogeneous and hypervascular endometrium highly suspicious for Retained Products Of Conception. 2. Negative for ovarian torsion. 3. Positive for two right ovarian cysts with both simple and hemorrhagic cyst morphology. These measure 3 - 3.9 cm  individually. Electronically Signed    By: Genevie Ann M.D.   On: 08/10/2018 20:37     Assessment / Plan:   Anna Ferguson is a 32 y.o. [id number] who presents with chronic blood loss anemia, pelvic pain, retained products of conception and ovarian cyst.  1. Transfuse 2u PRBC and redraw H/H to determine effecacy.  Need to be stable prior to going to OR 2. Due to 5cm ovarian cyst, will offer laparoscopy for cystectomy in addition to D&E.   3. Will work with OR to get her on schedule either Tues or Weds depending on her hemodynamic improvement, results of drug testing, and bleeding/pain level.  Admit to med/surg for OBS.  If needing >2 midnights for this treatment/care will change to inpatient admission.   Thank you for the opportunity to be involved with this patient's care.  ----- Larey Days, MD Attending Obstetrician and Gynecologist [facility], Department of [facility]

## 2018-08-12 DIAGNOSIS — O034 Incomplete spontaneous abortion without complication: Secondary | ICD-10-CM | POA: Diagnosis present

## 2018-08-12 DIAGNOSIS — N83201 Unspecified ovarian cyst, right side: Secondary | ICD-10-CM | POA: Diagnosis present

## 2018-08-12 DIAGNOSIS — D62 Acute posthemorrhagic anemia: Secondary | ICD-10-CM | POA: Diagnosis present

## 2018-08-12 DIAGNOSIS — Z302 Encounter for sterilization: Secondary | ICD-10-CM | POA: Diagnosis not present

## 2018-08-12 DIAGNOSIS — F17210 Nicotine dependence, cigarettes, uncomplicated: Secondary | ICD-10-CM | POA: Diagnosis present

## 2018-08-12 LAB — BPAM RBC
BLOOD PRODUCT EXPIRATION DATE: 201910082359
Blood Product Expiration Date: 201910082359
Blood Product Expiration Date: 201910082359
Blood Product Expiration Date: 201910082359
ISSUE DATE / TIME: 201909162312
ISSUE DATE / TIME: 201909171412
ISSUE DATE / TIME: 201909170124
ISSUE DATE / TIME: 201909171113
UNIT TYPE AND RH: 6200
UNIT TYPE AND RH: 6200
Unit Type and Rh: 6200
Unit Type and Rh: 6200

## 2018-08-12 LAB — TYPE AND SCREEN
ABO/RH(D): A POS
ANTIBODY SCREEN: NEGATIVE
UNIT DIVISION: 0
UNIT DIVISION: 0
Unit division: 0
Unit division: 0

## 2018-08-12 LAB — URINE DRUG SCREEN, QUALITATIVE (ARMC ONLY)
Amphetamines, Ur Screen: POSITIVE — AB
Barbiturates, Ur Screen: NOT DETECTED
Benzodiazepine, Ur Scrn: NOT DETECTED
CANNABINOID 50 NG, UR ~~LOC~~: NOT DETECTED
COCAINE METABOLITE, UR ~~LOC~~: POSITIVE — AB
MDMA (Ecstasy)Ur Screen: NOT DETECTED
Methadone Scn, Ur: NOT DETECTED
Opiate, Ur Screen: NOT DETECTED
Phencyclidine (PCP) Ur S: NOT DETECTED
TRICYCLIC, UR SCREEN: NOT DETECTED

## 2018-08-12 NOTE — Progress Notes (Signed)
Obstetric and Gynecology  Subjective  Anna Ferguson is a 32 y.o. female [phone] who presented on 08/10/2018 for continued bleeding after miscarriage several weeks ago, and was found to be profoundly anemic with a concern for retained POC, but a normal WBC.  She also has a hx of polypsubstance abuse with cocaine positive on UDS.   She was admitted and transfused 4u pRBC to bring her H/H up from 6.5/20.6 to 10/29.6 yesterday morning.  She is still cocaine positive and new finding of amphetamines in UDS, which pharmacy confirms maybe from po Sudafed, which patient is taking.  Objective   Vitals:   08/12/18 0801 08/12/18 1144  BP: (!) 90/57 97/62  Pulse: 70 66  Resp: 20 18  Temp: 98.5 F (36.9 C) 98.6 F (37 C)  SpO2: 98% 98%     Intake/Output Summary (Last 24 hours) at 08/12/2018 1411 Last data filed at 08/12/2018 1016 Gross per 24 hour  Intake 1453 ml  Output 800 ml  Net 653 ml    Physical exam deferred at patient's request. She was in the bathroom for two visits.  Labs: Results for orders placed or performed during the hospital encounter of 08/10/18 (from the past 24 hour(s))  Hemoglobin and hematocrit, blood     Status: Abnormal   Collection Time: 08/11/18  9:33 PM  Result Value Ref Range   Hemoglobin 10.0 (L) 12.0 - 16.0 g/dL   HCT 29.6 (L) 35.0 - 47.0 %  Urine Drug Screen, Qualitative ([facility] only)     Status: Abnormal   Collection Time: 08/12/18  8:48 AM  Result Value Ref Range   Tricyclic, Ur Screen NONE DETECTED NONE DETECTED   Amphetamines, Ur Screen POSITIVE (A) NONE DETECTED   MDMA (Ecstasy)Ur Screen NONE DETECTED NONE DETECTED   Cocaine Metabolite,Ur [place] POSITIVE (A) NONE DETECTED   Opiate, Ur Screen NONE DETECTED NONE DETECTED   Phencyclidine (PCP) Ur S NONE DETECTED NONE DETECTED   Cannabinoid 50 Ng, Ur [place] NONE DETECTED NONE DETECTED   Barbiturates, Ur Screen NONE DETECTED NONE DETECTED   Benzodiazepine, Ur Scrn NONE DETECTED NONE DETECTED    Methadone Scn, Ur NONE DETECTED NONE DETECTED    Cultures: No results found for this or any previous visit.  Imaging: US Ob Comp Less 14 Wks  Result Date: 08/10/2018 CLINICAL DATA:  32 year old female reports miscarriage 3 weeks ago, but still with heavy bleeding. EXAM: OBSTETRIC <14 WK [place] AND TRANSVAGINAL OB US DOPPLER ULTRASOUND OF OVARIES TECHNIQUE: Both transabdominal and transvaginal ultrasound examinations were performed for complete evaluation of the gestation as well as the maternal uterus, adnexal regions, and pelvic cul-de-sac. Transvaginal technique was performed to assess early pregnancy. Color and duplex Doppler ultrasound was utilized to evaluate blood flow to the ovaries. COMPARISON:  Early Ob ultrasound 06/10/2018 FINDINGS: Intrauterine gestational sac: None Yolk sac:  None Embryo:  None Cardiac Activity: None Subchorionic hemorrhage:  Not applicable Maternal uterus/adnexae: Thickened, heterogeneous, and hyper vascular endometrium (image 21) measuring up to 2.7 centimeters in thickness. The left ovary measures 2.9 x 2.2 x 2.7 centimeters and appears within normal limits. Preserved low resistance arterial and venous waveforms. The right ovary is enlarged measuring up to 5.7 x 3.5 x 4.3 centimeters and contains both a simple appearing cystic area measuring up to 3 centimeters and a complex 3.9 centimeter hypoechoic area with a reticular pattern of internal echoes. Neither of these demonstrates internal vascularity (image 53), but there are preserved low resistance arterial and venous waveforms elsewhere within  the right ovary. There is a small amount of simple appearing free fluid adjacent to the left ovary. IMPRESSION: 1. Thickened, heterogeneous and hypervascular endometrium highly suspicious for Retained Products Of Conception. 2. Negative for ovarian torsion. 3. Positive for two right ovarian cysts with both simple and hemorrhagic cyst morphology. These measure 3 - 3.9 cm individually.  Electronically Signed   By: Genevie Ann M.D.   On: 08/10/2018 20:37   US Ob Transvaginal  Result Date: 08/10/2018 CLINICAL DATA:  32 year old female reports miscarriage 3 weeks ago, but still with heavy bleeding. EXAM: OBSTETRIC <14 WK [place] AND TRANSVAGINAL OB US DOPPLER ULTRASOUND OF OVARIES TECHNIQUE: Both transabdominal and transvaginal ultrasound examinations were performed for complete evaluation of the gestation as well as the maternal uterus, adnexal regions, and pelvic cul-de-sac. Transvaginal technique was performed to assess early pregnancy. Color and duplex Doppler ultrasound was utilized to evaluate blood flow to the ovaries. COMPARISON:  Early Ob ultrasound 06/10/2018 FINDINGS: Intrauterine gestational sac: None Yolk sac:  None Embryo:  None Cardiac Activity: None Subchorionic hemorrhage:  Not applicable Maternal uterus/adnexae: Thickened, heterogeneous, and hyper vascular endometrium (image 21) measuring up to 2.7 centimeters in thickness. The left ovary measures 2.9 x 2.2 x 2.7 centimeters and appears within normal limits. Preserved low resistance arterial and venous waveforms. The right ovary is enlarged measuring up to 5.7 x 3.5 x 4.3 centimeters and contains both a simple appearing cystic area measuring up to 3 centimeters and a complex 3.9 centimeter hypoechoic area with a reticular pattern of internal echoes. Neither of these demonstrates internal vascularity (image 53), but there are preserved low resistance arterial and venous waveforms elsewhere within the right ovary. There is a small amount of simple appearing free fluid adjacent to the left ovary. IMPRESSION: 1. Thickened, heterogeneous and hypervascular endometrium highly suspicious for Retained Products Of Conception. 2. Negative for ovarian torsion. 3. Positive for two right ovarian cysts with both simple and hemorrhagic cyst morphology. These measure 3 - 3.9 cm individually. Electronically Signed   By: Genevie Ann M.D.   On: 08/10/2018 20:37    US Pelvic Doppler (torsion R/o Or Mass Arterial Flow)  Result Date: 08/10/2018 CLINICAL DATA:  32 year old female reports miscarriage 3 weeks ago, but still with heavy bleeding. EXAM: OBSTETRIC <14 WK [place] AND TRANSVAGINAL OB US DOPPLER ULTRASOUND OF OVARIES TECHNIQUE: Both transabdominal and transvaginal ultrasound examinations were performed for complete evaluation of the gestation as well as the maternal uterus, adnexal regions, and pelvic cul-de-sac. Transvaginal technique was performed to assess early pregnancy. Color and duplex Doppler ultrasound was utilized to evaluate blood flow to the ovaries. COMPARISON:  Early Ob ultrasound 06/10/2018 FINDINGS: Intrauterine gestational sac: None Yolk sac:  None Embryo:  None Cardiac Activity: None Subchorionic hemorrhage:  Not applicable Maternal uterus/adnexae: Thickened, heterogeneous, and hyper vascular endometrium (image 21) measuring up to 2.7 centimeters in thickness. The left ovary measures 2.9 x 2.2 x 2.7 centimeters and appears within normal limits. Preserved low resistance arterial and venous waveforms. The right ovary is enlarged measuring up to 5.7 x 3.5 x 4.3 centimeters and contains both a simple appearing cystic area measuring up to 3 centimeters and a complex 3.9 centimeter hypoechoic area with a reticular pattern of internal echoes. Neither of these demonstrates internal vascularity (image 53), but there are preserved low resistance arterial and venous waveforms elsewhere within the right ovary. There is a small amount of simple appearing free fluid adjacent to the left ovary. IMPRESSION: 1. Thickened, heterogeneous and hypervascular endometrium highly  suspicious for Retained Products Of Conception. 2. Negative for ovarian torsion. 3. Positive for two right ovarian cysts with both simple and hemorrhagic cyst morphology. These measure 3 - 3.9 cm individually. Electronically Signed   By: Genevie Ann M.D.   On: 08/10/2018 20:37     Assessment   32  y.o. [facility] Day: 3   Plan   1. Anemia: Likely acute on chronic blood loss anemia, but we have laboratory confirmation of acute anemia only.  - s/p 4 u pRBC, and is stable - bleeding continues, and repeat transfusion if clinically indicated  2. Retained POC without evidence of endometritis: - suction D&E planned when stable for anesthesia  3. Cocaine positive: - remain inpatient until stable for anesthesia  - amphetamine positive on UDS may be from sudafed intake  4. Contraception choices not discussed today

## 2018-08-12 NOTE — Progress Notes (Signed)
   08/12/18 1345  Clinical Encounter Type  Visited With Patient  Visit Type Initial   While rounding on unit, staff suggested chaplain follow up with patient.  Chaplain attempted introductory visit, but patient declined visit as she wanted to rest.

## 2018-08-13 ENCOUNTER — Encounter: Payer: Self-pay | Admitting: Anesthesiology

## 2018-08-13 LAB — CBC
HCT: 27.1 % — ABNORMAL LOW (ref 35.0–47.0)
HEMOGLOBIN: 9 g/dL — AB (ref 12.0–16.0)
MCH: 26.1 pg (ref 26.0–34.0)
MCHC: 33.3 g/dL (ref 32.0–36.0)
MCV: 78.4 fL — AB (ref 80.0–100.0)
Platelets: 211 10*3/uL (ref 150–440)
RBC: 3.45 MIL/uL — ABNORMAL LOW (ref 3.80–5.20)
RDW: 23.2 % — ABNORMAL HIGH (ref 11.5–14.5)
WBC: 4.6 10*3/uL (ref 3.6–11.0)

## 2018-08-13 LAB — URINE DRUG SCREEN, QUALITATIVE (ARMC ONLY)
Amphetamines, Ur Screen: NOT DETECTED
BARBITURATES, UR SCREEN: NOT DETECTED
Benzodiazepine, Ur Scrn: NOT DETECTED
CANNABINOID 50 NG, UR ~~LOC~~: NOT DETECTED
COCAINE METABOLITE, UR ~~LOC~~: NOT DETECTED
MDMA (Ecstasy)Ur Screen: NOT DETECTED
Methadone Scn, Ur: NOT DETECTED
OPIATE, UR SCREEN: NOT DETECTED
PHENCYCLIDINE (PCP) UR S: NOT DETECTED
Tricyclic, Ur Screen: NOT DETECTED

## 2018-08-13 NOTE — Progress Notes (Signed)
Obstetric and Gynecology  HD # 4  Subjective  Anna Ferguson is a 32 y.o. female [phone] who presented on 08/10/2018 for continued bleeding after miscarriage several weeks ago, and was found to be profoundly anemic with a concern for retained POC, but a normal WBC.  She also has a history of polypsubstance abuse with cocaine positive on initial UDS.   She was admitted and transfused 4u pRBC. Her H&H today were 9.0/27.1.  Urine drug screen was negative for any substances today.   Patient doing well. Reports continued moderate to heavy bleeding, changing large pads 4 times today. Still passing small to moderate sized clots.   Objective  Objective:   Vitals:   08/12/18 1144 08/12/18 1915 08/12/18 2333 08/13/18 0820  BP: 97/62 112/79 120/79 101/69  Pulse: 66 62 66 60  Resp: 18 18 18 18   Temp: 98.6 F (37 C) 98.4 F (36.9 C) 99.2 F (37.3 C) 97.8 F (36.6 C)  TempSrc: Oral Oral Oral Oral  SpO2: 98%  99% 99%  Weight:      Height:       Temp:  [97.8 F (36.6 C)-99.2 F (37.3 C)] 97.8 F (36.6 C) (09/19 0820) Pulse Rate:  [60-66] 60 (09/19 0820) Resp:  [18] 18 (09/19 0820) BP: (101-120)/(69-79) 101/69 (09/19 0820) SpO2:  [99 %] 99 % (09/19 0820) I/O last 3 completed shifts: In: 500 [IV Piggyback:500] Out: 1850 [Urine:1850] Total I/O In: 240 [P.O.:240] Out: 560 [Urine:560]  Intake/Output Summary (Last 24 hours) at 08/13/2018 1858 Last data filed at 08/13/2018 1648 Gross per 24 hour  Intake 240 ml  Output 1160 ml  Net -920 ml   Constitutional: NAD, AAOx3  HE/ENT: moist mucous membranes PULM: normal respiratory effort Psych: mood appropriate, speech normal Pelvic deferred  Labs: Results for orders placed or performed during the hospital encounter of 08/10/18 (from the past 24 hour(s))  CBC     Status: Abnormal   Collection Time: 08/13/18  1:02 PM  Result Value Ref Range   WBC 4.6 3.6 - 11.0 K/uL   RBC 3.45 (L) 3.80 - 5.20 MIL/uL   Hemoglobin 9.0  (L) 12.0 - 16.0 g/dL   HCT 27.1 (L) 35.0 - 47.0 %   MCV 78.4 (L) 80.0 - 100.0 fL   MCH 26.1 26.0 - 34.0 pg   MCHC 33.3 32.0 - 36.0 g/dL   RDW 23.2 (H) 11.5 - 14.5 %   Platelets 211 150 - 440 K/uL  Urine Drug Screen, Qualitative ([facility] only)     Status: None   Collection Time: 08/13/18  4:44 PM  Result Value Ref Range   Tricyclic, Ur Screen NONE DETECTED NONE DETECTED   Amphetamines, Ur Screen NONE DETECTED NONE DETECTED   MDMA (Ecstasy)Ur Screen NONE DETECTED NONE DETECTED   Cocaine Metabolite,Ur [place] NONE DETECTED NONE DETECTED   Opiate, Ur Screen NONE DETECTED NONE DETECTED   Phencyclidine (PCP) Ur S NONE DETECTED NONE DETECTED   Cannabinoid 50 Ng, Ur [place] NONE DETECTED NONE DETECTED   Barbiturates, Ur Screen NONE DETECTED NONE DETECTED   Benzodiazepine, Ur Scrn NONE DETECTED NONE DETECTED   Methadone Scn, Ur NONE DETECTED NONE DETECTED    Imaging: US Ob Comp Less 14 Wks  Result Date: 08/10/2018 CLINICAL DATA:  32 year old female reports miscarriage 3 weeks ago, but still with heavy bleeding. EXAM: OBSTETRIC <14 WK [place] AND TRANSVAGINAL OB US DOPPLER ULTRASOUND OF OVARIES TECHNIQUE: Both transabdominal and transvaginal ultrasound examinations were performed for complete evaluation of the gestation as  well as the maternal uterus, adnexal regions, and pelvic cul-de-sac. Transvaginal technique was performed to assess early pregnancy. Color and duplex Doppler ultrasound was utilized to evaluate blood flow to the ovaries. COMPARISON:  Early Ob ultrasound 06/10/2018 FINDINGS: Intrauterine gestational sac: None Yolk sac:  None Embryo:  None Cardiac Activity: None Subchorionic hemorrhage:  Not applicable Maternal uterus/adnexae: Thickened, heterogeneous, and hyper vascular endometrium (image 21) measuring up to 2.7 centimeters in thickness. The left ovary measures 2.9 x 2.2 x 2.7 centimeters and appears within normal limits. Preserved low resistance arterial and venous waveforms. The right ovary is  enlarged measuring up to 5.7 x 3.5 x 4.3 centimeters and contains both a simple appearing cystic area measuring up to 3 centimeters and a complex 3.9 centimeter hypoechoic area with a reticular pattern of internal echoes. Neither of these demonstrates internal vascularity (image 53), but there are preserved low resistance arterial and venous waveforms elsewhere within the right ovary. There is a small amount of simple appearing free fluid adjacent to the left ovary. IMPRESSION: 1. Thickened, heterogeneous and hypervascular endometrium highly suspicious for Retained Products Of Conception. 2. Negative for ovarian torsion. 3. Positive for two right ovarian cysts with both simple and hemorrhagic cyst morphology. These measure 3 - 3.9 cm individually. Electronically Signed   By: Genevie Ann M.D.   On: 08/10/2018 20:37   US Ob Transvaginal  Result Date: 08/10/2018 CLINICAL DATA:  32 year old female reports miscarriage 3 weeks ago, but still with heavy bleeding. EXAM: OBSTETRIC <14 WK [place] AND TRANSVAGINAL OB US DOPPLER ULTRASOUND OF OVARIES TECHNIQUE: Both transabdominal and transvaginal ultrasound examinations were performed for complete evaluation of the gestation as well as the maternal uterus, adnexal regions, and pelvic cul-de-sac. Transvaginal technique was performed to assess early pregnancy. Color and duplex Doppler ultrasound was utilized to evaluate blood flow to the ovaries. COMPARISON:  Early Ob ultrasound 06/10/2018 FINDINGS: Intrauterine gestational sac: None Yolk sac:  None Embryo:  None Cardiac Activity: None Subchorionic hemorrhage:  Not applicable Maternal uterus/adnexae: Thickened, heterogeneous, and hyper vascular endometrium (image 21) measuring up to 2.7 centimeters in thickness. The left ovary measures 2.9 x 2.2 x 2.7 centimeters and appears within normal limits. Preserved low resistance arterial and venous waveforms. The right ovary is enlarged measuring up to 5.7 x 3.5 x 4.3 centimeters and  contains both a simple appearing cystic area measuring up to 3 centimeters and a complex 3.9 centimeter hypoechoic area with a reticular pattern of internal echoes. Neither of these demonstrates internal vascularity (image 53), but there are preserved low resistance arterial and venous waveforms elsewhere within the right ovary. There is a small amount of simple appearing free fluid adjacent to the left ovary. IMPRESSION: 1. Thickened, heterogeneous and hypervascular endometrium highly suspicious for Retained Products Of Conception. 2. Negative for ovarian torsion. 3. Positive for two right ovarian cysts with both simple and hemorrhagic cyst morphology. These measure 3 - 3.9 cm individually. Electronically Signed   By: Genevie Ann M.D.   On: 08/10/2018 20:37   US Pelvic Doppler (torsion R/o Or Mass Arterial Flow)  Result Date: 08/10/2018 CLINICAL DATA:  32 year old female reports miscarriage 3 weeks ago, but still with heavy bleeding. EXAM: OBSTETRIC <14 WK [place] AND TRANSVAGINAL OB US DOPPLER ULTRASOUND OF OVARIES TECHNIQUE: Both transabdominal and transvaginal ultrasound examinations were performed for complete evaluation of the gestation as well as the maternal uterus, adnexal regions, and pelvic cul-de-sac. Transvaginal technique was performed to assess early pregnancy. Color and duplex Doppler ultrasound was utilized to  evaluate blood flow to the ovaries. COMPARISON:  Early Ob ultrasound 06/10/2018 FINDINGS: Intrauterine gestational sac: None Yolk sac:  None Embryo:  None Cardiac Activity: None Subchorionic hemorrhage:  Not applicable Maternal uterus/adnexae: Thickened, heterogeneous, and hyper vascular endometrium (image 21) measuring up to 2.7 centimeters in thickness. The left ovary measures 2.9 x 2.2 x 2.7 centimeters and appears within normal limits. Preserved low resistance arterial and venous waveforms. The right ovary is enlarged measuring up to 5.7 x 3.5 x 4.3 centimeters and contains both a simple  appearing cystic area measuring up to 3 centimeters and a complex 3.9 centimeter hypoechoic area with a reticular pattern of internal echoes. Neither of these demonstrates internal vascularity (image 53), but there are preserved low resistance arterial and venous waveforms elsewhere within the right ovary. There is a small amount of simple appearing free fluid adjacent to the left ovary. IMPRESSION: 1. Thickened, heterogeneous and hypervascular endometrium highly suspicious for Retained Products Of Conception. 2. Negative for ovarian torsion. 3. Positive for two right ovarian cysts with both simple and hemorrhagic cyst morphology. These measure 3 - 3.9 cm individually. Electronically Signed   By: Genevie Ann M.D.   On: 08/10/2018 20:37   Assessment   32 y.o. [place] [facility] Day: 4   Plan   1. Anemia:  Likely acute on chronic blood loss anemia, but we have laboratory confirmation of acute anemia only.  - s/p 4 u pRBC - H&H stable - bleeding continues, plan to repeat transfusion if clinically indicated  2. Retained POC without evidence of endometritis: - suction D&E planned for tomorrow with Dr. Leonides Schanz  3. Contraception: - Desires sterilization with tubal ligation. Dr. Leonides Schanz aware.    Lisette Grinder, [place] 08/13/2018 6:58 PM

## 2018-08-13 NOTE — Anesthesia Preprocedure Evaluation (Addendum)
Anesthesia Evaluation  Patient identified by MRN, date of birth, ID band Patient awake    Reviewed: Allergy & Precautions, H&P , NPO status , Patient's Chart, lab work & pertinent test results, reviewed documented beta blocker date and time   History of Anesthesia Complications Negative for: history of anesthetic complications  Airway Mallampati: II  TM Distance: >3 FB Neck ROM: full    Dental  (+) Chipped, Poor Dentition, Missing   Pulmonary neg shortness of breath, pneumonia, Current Smoker,           Cardiovascular Exercise Tolerance: Good (-) angina(-) Past MI and (-) DOE negative cardio ROS       Neuro/Psych negative neurological ROS  negative psych ROS   GI/Hepatic negative GI ROS, Neg liver ROS,   Endo/Other  negative endocrine ROS  Renal/GU      Musculoskeletal   Abdominal   Peds  Hematology negative hematology ROS (+) anemia ,   Anesthesia Other Findings Past Medical History: No date: Cancer ([place]) No date: Miscarriage No date: Scoliosis  Reproductive/Obstetrics negative OB ROS                            Anesthesia Physical  Anesthesia Plan  ASA: III  Anesthesia Plan: General ETT   Post-op Pain Management:    Induction: Intravenous  PONV Risk Score and Plan: Ondansetron, Dexamethasone, Midazolam and Treatment may vary due to age or medical condition  Airway Management Planned: Oral ETT  Additional Equipment:   Intra-op Plan:   Post-operative Plan: Extubation in OR  Informed Consent: I have reviewed the patients History and Physical, chart, labs and discussed the procedure including the risks, benefits and alternatives for the proposed anesthesia with the patient or authorized representative who has indicated his/her understanding and acceptance.   Dental Advisory Given  Plan Discussed with: CRNA  Anesthesia Plan Comments: (Patient consented for risks of  anesthesia including but not limited to:  - adverse reactions to medications - damage to teeth, lips or other oral mucosa - sore throat or hoarseness - Damage to heart, brain, lungs or loss of life  Patient voiced understanding.)       Anesthesia Quick Evaluation

## 2018-08-14 ENCOUNTER — Inpatient Hospital Stay: Payer: Medicaid Other | Admitting: Anesthesiology

## 2018-08-14 ENCOUNTER — Encounter
Admission: EM | Disposition: A | Discharge status: Home / Self Care | Payer: Self-pay | Source: Home / Self Care | Attending: Obstetrics and Gynecology

## 2018-08-14 HISTORY — PX: LAPAROSCOPIC OVARIAN CYSTECTOMY: SHX6248

## 2018-08-14 HISTORY — PX: DILATION AND EVACUATION: SHX1459

## 2018-08-14 LAB — RAPID HIV SCREEN (HIV 1/2 AB+AG)
HIV 1/2 Antibodies: NONREACTIVE
HIV-1 P24 Antigen - HIV24: NONREACTIVE

## 2018-08-14 LAB — TYPE AND SCREEN
ABO/RH(D): A POS
ANTIBODY SCREEN: NEGATIVE

## 2018-08-14 SURGERY — DILATION AND EVACUATION, UTERUS
Anesthesia: General | Site: Abdomen

## 2018-08-14 MED ORDER — OXYCODONE HCL 5 MG/5ML PO SOLN: 5.0000 mg | Freq: Once | ORAL | Status: DC | PRN

## 2018-08-14 MED ORDER — ACETAMINOPHEN 500 MG PO TABS: 1000.0000 mg | ORAL_TABLET | Freq: Four times a day (QID) | ORAL | 0 refills | Status: AC

## 2018-08-14 MED ORDER — PROPOFOL 10 MG/ML IV BOLUS
INTRAVENOUS | Status: AC
  Filled 2018-08-14: qty 20

## 2018-08-14 MED ORDER — ROCURONIUM BROMIDE 100 MG/10ML IV SOLN
INTRAVENOUS | Status: DC | PRN
  Administered 2018-08-14: 20 mg via INTRAVENOUS

## 2018-08-14 MED ORDER — OXYCODONE HCL 5 MG PO TABS: 5.0000 mg | ORAL_TABLET | Freq: Once | ORAL | Status: DC | PRN

## 2018-08-14 MED ORDER — ONDANSETRON HCL 4 MG/2ML IJ SOLN
INTRAMUSCULAR | Status: DC | PRN
  Administered 2018-08-14: 4 mg via INTRAVENOUS

## 2018-08-14 MED ORDER — HYDROMORPHONE HCL 1 MG/ML IJ SOLN
0.5000 mg | INTRAMUSCULAR | Status: DC | PRN
  Administered 2018-08-14 (×3): 0.5 mg via INTRAVENOUS

## 2018-08-14 MED ORDER — ROCURONIUM BROMIDE 50 MG/5ML IV SOLN
INTRAVENOUS | Status: AC
  Filled 2018-08-14: qty 1

## 2018-08-14 MED ORDER — OXYCODONE HCL 5 MG PO TABS: 5.0000 mg | ORAL_TABLET | ORAL | 0 refills | Status: AC | PRN

## 2018-08-14 MED ORDER — SUCCINYLCHOLINE CHLORIDE 20 MG/ML IJ SOLN
INTRAMUSCULAR | Status: AC
  Filled 2018-08-14: qty 1

## 2018-08-14 MED ORDER — IBUPROFEN 800 MG PO TABS: 800.0000 mg | ORAL_TABLET | Freq: Four times a day (QID) | ORAL | 1 refills | Status: DC

## 2018-08-14 MED ORDER — LACTATED RINGERS IV SOLN
INTRAVENOUS | Status: DC
  Administered 2018-08-14: 15:00:00 via INTRAVENOUS

## 2018-08-14 MED ORDER — FENTANYL CITRATE (PF) 100 MCG/2ML IJ SOLN
INTRAMUSCULAR | Status: AC
  Filled 2018-08-14: qty 2

## 2018-08-14 MED ORDER — SUGAMMADEX SODIUM 200 MG/2ML IV SOLN
INTRAVENOUS | Status: DC | PRN
  Administered 2018-08-14: 100 mg via INTRAVENOUS

## 2018-08-14 MED ORDER — MIDAZOLAM HCL 2 MG/2ML IJ SOLN
INTRAMUSCULAR | Status: DC | PRN
  Administered 2018-08-14: 2 mg via INTRAVENOUS

## 2018-08-14 MED ORDER — LIDOCAINE HCL (PF) 2 % IJ SOLN
INTRAMUSCULAR | Status: AC
  Filled 2018-08-14: qty 10

## 2018-08-14 MED ORDER — FENTANYL CITRATE (PF) 100 MCG/2ML IJ SOLN
25.0000 ug | INTRAMUSCULAR | Status: DC | PRN
  Administered 2018-08-14 (×2): 50 ug via INTRAVENOUS

## 2018-08-14 MED ORDER — SILVER NITRATE-POT NITRATE 75-25 % EX MISC
CUTANEOUS | Status: DC | PRN
  Administered 2018-08-14: 2

## 2018-08-14 MED ORDER — SUCCINYLCHOLINE CHLORIDE 20 MG/ML IJ SOLN
INTRAMUSCULAR | Status: DC | PRN
  Administered 2018-08-14: 80 mg via INTRAVENOUS

## 2018-08-14 MED ORDER — MIDAZOLAM HCL 2 MG/2ML IJ SOLN
INTRAMUSCULAR | Status: AC
  Filled 2018-08-14: qty 2

## 2018-08-14 MED ORDER — SUGAMMADEX SODIUM 200 MG/2ML IV SOLN
INTRAVENOUS | Status: AC
  Filled 2018-08-14: qty 2

## 2018-08-14 MED ORDER — KETOROLAC TROMETHAMINE 30 MG/ML IJ SOLN
30.0000 mg | Freq: Four times a day (QID) | INTRAMUSCULAR | Status: DC | PRN
  Administered 2018-08-14 – 2018-08-15 (×3): 30 mg via INTRAVENOUS
  Filled 2018-08-14 (×3): qty 1

## 2018-08-14 MED ORDER — PROPOFOL 10 MG/ML IV BOLUS
INTRAVENOUS | Status: DC | PRN
  Administered 2018-08-14: 150 mg via INTRAVENOUS
  Administered 2018-08-14: 50 mg via INTRAVENOUS

## 2018-08-14 MED ORDER — SODIUM CHLORIDE 0.9 % IV SOLN
100.0000 mg | Freq: Once | INTRAVENOUS | Status: AC
  Administered 2018-08-14: 100 mg via INTRAVENOUS
  Filled 2018-08-14 (×2): qty 100

## 2018-08-14 MED ORDER — HYDROMORPHONE HCL 1 MG/ML IJ SOLN
INTRAMUSCULAR | Status: AC
  Administered 2018-08-14: 0.5 mg via INTRAVENOUS
  Filled 2018-08-14: qty 1

## 2018-08-14 MED ORDER — LIDOCAINE HCL (CARDIAC) PF 100 MG/5ML IV SOSY
PREFILLED_SYRINGE | INTRAVENOUS | Status: DC | PRN
  Administered 2018-08-14: 60 mg via INTRAVENOUS

## 2018-08-14 MED ORDER — ONDANSETRON HCL 4 MG/2ML IJ SOLN
INTRAMUSCULAR | Status: AC
  Filled 2018-08-14: qty 2

## 2018-08-14 MED ORDER — DEXAMETHASONE SODIUM PHOSPHATE 10 MG/ML IJ SOLN
INTRAMUSCULAR | Status: AC
  Filled 2018-08-14: qty 1

## 2018-08-14 MED ORDER — OXYCODONE HCL 5 MG PO TABS
5.0000 mg | ORAL_TABLET | ORAL | Status: DC | PRN
  Administered 2018-08-14 – 2018-08-15 (×4): 5 mg via ORAL
  Filled 2018-08-14 (×4): qty 1

## 2018-08-14 MED ORDER — FENTANYL CITRATE (PF) 100 MCG/2ML IJ SOLN
INTRAMUSCULAR | Status: AC
  Administered 2018-08-14: 50 ug via INTRAVENOUS
  Filled 2018-08-14: qty 2

## 2018-08-14 MED ORDER — FENTANYL CITRATE (PF) 100 MCG/2ML IJ SOLN
INTRAMUSCULAR | Status: DC | PRN
  Administered 2018-08-14 (×3): 50 ug via INTRAVENOUS

## 2018-08-14 MED ORDER — SEVOFLURANE IN SOLN
RESPIRATORY_TRACT | Status: AC
  Filled 2018-08-14: qty 250

## 2018-08-14 MED ORDER — DEXAMETHASONE SODIUM PHOSPHATE 10 MG/ML IJ SOLN
INTRAMUSCULAR | Status: DC | PRN
  Administered 2018-08-14: 5 mg via INTRAVENOUS

## 2018-08-14 SURGICAL SUPPLY — 59 items
"PENCIL ELECTRO HAND CTR " (MISCELLANEOUS) IMPLANT
BAG URINE DRAINAGE (UROLOGICAL SUPPLIES) ×5 IMPLANT
BLADE SURG SZ11 CARB STEEL (BLADE) ×5 IMPLANT
CANISTER SUC SOCK COL 7IN (MISCELLANEOUS) ×2 IMPLANT
CANISTER SUCT 1200ML W/VALVE (MISCELLANEOUS) ×5 IMPLANT
CATH FOLEY 2WAY  5CC 16FR (CATHETERS) ×2
CATH ROBINSON RED A/P 16FR (CATHETERS) ×5 IMPLANT
CATH URTH 16FR FL 2W BLN LF (CATHETERS) ×3 IMPLANT
CHLORAPREP W/TINT 26ML (MISCELLANEOUS) ×5 IMPLANT
DERMABOND ADVANCED (GAUZE/BANDAGES/DRESSINGS) ×2
DERMABOND ADVANCED .7 DNX12 (GAUZE/BANDAGES/DRESSINGS) ×1 IMPLANT
DRAPE LEGGINS SURG 28X43 STRL (DRAPES) ×5 IMPLANT
DRAPE UNDER BUTTOCK W/FLU (DRAPES) ×5 IMPLANT
FILTER UTR ASPR SPEC (MISCELLANEOUS) ×2 IMPLANT
FLTR UTR ASPR SPEC (MISCELLANEOUS)
GLOVE PI ORTHOPRO 6.5 (GLOVE) ×2
GLOVE PI ORTHOPRO STRL 6.5 (GLOVE) ×3 IMPLANT
GLOVE SURG SYN 6.5 ES PF (GLOVE) ×12 IMPLANT
GLOVE SURG SYN 6.5 PF PI (GLOVE) ×2 IMPLANT
GOWN STRL REUS W/ TWL LRG LVL3 (GOWN DISPOSABLE) ×6 IMPLANT
GOWN STRL REUS W/ TWL XL LVL3 (GOWN DISPOSABLE) ×3 IMPLANT
GOWN STRL REUS W/TWL LRG LVL3 (GOWN DISPOSABLE) ×4
GOWN STRL REUS W/TWL XL LVL3 (GOWN DISPOSABLE) ×2
GRASPER SUT TROCAR 14GX15 (MISCELLANEOUS) ×3 IMPLANT
IRRIGATION STRYKERFLOW (MISCELLANEOUS) IMPLANT
IRRIGATOR STRYKERFLOW (MISCELLANEOUS)
IV LACTATED RINGERS 1000ML (IV SOLUTION) ×5 IMPLANT
KIT BERKELEY 1ST TRIMESTER 3/8 (MISCELLANEOUS) ×5 IMPLANT
KIT PINK PAD W/HEAD ARE REST (MISCELLANEOUS) ×4
KIT PINK PAD W/HEAD ARM REST (MISCELLANEOUS) ×3 IMPLANT
KIT TURNOVER CYSTO (KITS) ×5 IMPLANT
L-HOOK LAP DISP 36CM (ELECTROSURGICAL) ×4
LABEL OR SOLS (LABEL) IMPLANT
LHOOK LAP DISP 36CM (ELECTROSURGICAL) ×1 IMPLANT
LIGASURE VESSEL 5MM BLUNT TIP (ELECTROSURGICAL) ×3 IMPLANT
NDL SPNL 20GX3.5 QUINCKE YW (NEEDLE) ×2 IMPLANT
NEEDLE SPNL 20GX3.5 QUINCKE YW (NEEDLE) ×4 IMPLANT
NS IRRIG 500ML POUR BTL (IV SOLUTION) ×5 IMPLANT
PACK DNC HYST (MISCELLANEOUS) ×5 IMPLANT
PACK LAP CHOLECYSTECTOMY (MISCELLANEOUS) ×5 IMPLANT
PAD OB MATERNITY 4.3X12.25 (PERSONAL CARE ITEMS) ×5 IMPLANT
PAD PREP 24X41 OB/GYN DISP (PERSONAL CARE ITEMS) ×5 IMPLANT
PENCIL ELECTRO HAND CTR (MISCELLANEOUS) ×4 IMPLANT
POUCH ENDO CATCH 10MM SPEC (MISCELLANEOUS) ×3 IMPLANT
SET BERKELEY SUCTION TUBING (SUCTIONS) ×5 IMPLANT
SLEEVE ENDOPATH XCEL 5M (ENDOMECHANICALS) ×10 IMPLANT
SUT MNCRL 4-0 (SUTURE) ×2
SUT MNCRL 4-0 27XMFL (SUTURE) ×2
SUT MNCRL AB 4-0 PS2 18 (SUTURE) ×5 IMPLANT
SUT VICRYL 0 UR6 27IN ABS (SUTURE) ×3 IMPLANT
SUTURE MNCRL 4-0 27XMF (SUTURE) ×3 IMPLANT
SYR 10ML LL (SYRINGE) ×5 IMPLANT
TOWEL OR 17X26 4PK STRL BLUE (TOWEL DISPOSABLE) ×5 IMPLANT
TROCAR XCEL NON-BLD 11X100MML (ENDOMECHANICALS) ×3 IMPLANT
TROCAR XCEL NON-BLD 5MMX100MML (ENDOMECHANICALS) ×5 IMPLANT
TUBING INSUFFLATION (TUBING) ×5 IMPLANT
VACURETTE 10 RIGID CVD (CANNULA) ×2 IMPLANT
VACURETTE 12 RIGID CVD (CANNULA) ×2 IMPLANT
VACURETTE 8 RIGID CVD (CANNULA) ×5 IMPLANT

## 2018-08-14 NOTE — Transfer of Care (Signed)
Immediate Anesthesia Transfer of Care Note  Patient: Anna Ferguson  Procedure(s) Performed: DILATATION AND EVACUATION (N/A Vagina ) LAPAROSCOPIC OVARIAN CYSTECTOMY (N/A Abdomen)  Patient Location: PACU  Anesthesia Type:General  Level of Consciousness: awake, alert  and oriented  Airway & Oxygen Therapy: Patient Spontanous Breathing and Patient connected to face mask oxygen  Post-op Assessment: Report given to RN and Post -op Vital signs reviewed and stable  Post vital signs: Reviewed and stable  Last Vitals:  Vitals Value Taken Time  BP 104/62 08/14/2018  4:45 PM  Temp 36.4 C 08/14/2018  4:45 PM  Pulse 69 08/14/2018  4:53 PM  Resp 13 08/14/2018  4:53 PM  SpO2 100 % 08/14/2018  4:53 PM  Vitals shown include unvalidated device data.  Last Pain:  Vitals:   08/14/18 1421  TempSrc: Tympanic  PainSc: 8       Patients Stated Pain Goal: 0 (41/14/64 3142)  Complications: No apparent anesthesia complications

## 2018-08-14 NOTE — Progress Notes (Signed)
Patient transported to OR at this time.     Hilbert Bible, RN

## 2018-08-14 NOTE — Anesthesia Procedure Notes (Signed)
Procedure Name: Intubation Date/Time: 08/14/2018 3:07 PM Performed by: Zetta Bills, CRNA Pre-anesthesia Checklist: Patient identified, Emergency Drugs available, Suction available and Patient being monitored Patient Re-evaluated:Patient Re-evaluated prior to induction Oxygen Delivery Method: Circle system utilized Preoxygenation: Pre-oxygenation with 100% oxygen Induction Type: IV induction Ventilation: Mask ventilation without difficulty Laryngoscope Size: Mac and 3 Grade View: Grade II Tube type: Oral Tube size: 7.0 mm Number of attempts: 1 Airway Equipment and Method: Stylet Placement Confirmation: ETT inserted through vocal cords under direct vision Secured at: 21 cm Tube secured with: Tape Dental Injury: Teeth and Oropharynx as per pre-operative assessment

## 2018-08-14 NOTE — Progress Notes (Signed)
Anna Salisbury Fairclothis a 32 y.o.female [id number] who presented on 9/16/2019for continued bleeding after miscarriage several weeks ago, and was found to be profoundly anemic with a concern for retained POC, but a normal WBC.  She also has a history of polypsubstance abuse with cocaine positive on initial UDS.   She was admitted and transfused 4u pRBC. Her H&H today were 9.0/27.1.  Urine drug screen was negative for any substances today.   Patient doing well. Reports continued moderate to heavy bleeding, changing large pads 4 times today. Still passing small to moderate sized clots.     Subjective: Patient reports lower abd pain and lower back pain this am.   Objective: .HEENT Gen:A,A&Ox3 HEENT: Normocephalic, Eyes non-icteric. HEART:S1S2, RRR, No M/R/G LUNGS:CTA bilat, no W/R/R ABD: TTP and flat, +audible BS present Extrems:warm, dry, NT, Neg Homan's Vitals:   08/13/18 0820 08/13/18 2016  BP: 101/69 101/67  Pulse: 60 60  Resp: 18 20  Temp: 97.8 F (36.6 C) 98.2 F (36.8 C)  SpO2: 99% 100%   Results for Anna Ferguson, Anna Ferguson (MRN [id number]) as of 08/14/2018 08:44  Ref. Range 08/11/2018 10:32 08/11/2018 21:33 08/12/2018 08:48 08/13/2018 13:02 08/13/2018 16:44  WBC Latest Ref Range: 3.6 - 11.0 K/uL    4.6   RBC Latest Ref Range: 3.80 - 5.20 MIL/uL    3.45 (L)   Hemoglobin Latest Ref Range: 12.0 - 16.0 g/dL  10.0 (L)  9.0 (L)   HCT Latest Ref Range: 35.0 - 47.0 %  29.6 (L)  27.1 (L)   MCV Latest Ref Range: 80.0 - 100.0 fL    78.4 (L)   MCH Latest Ref Range: 26.0 - 34.0 pg    26.1   MCHC Latest Ref Range: 32.0 - 36.0 g/dL    33.3   RDW Latest Ref Range: 11.5 - 14.5 %    23.2 (H)   Platelets Latest Ref Range: 150 - 440 K/uL    211    Assessment/Plan: A:1. Incomplete AB 2. Polysubstance Abuse (now neg drug screen) 3 Permanent sterility desired:wants BTL P:D&C planned at 1 pm today with Dr Leonides Schanz.   LOS: 2 days  _______________________________ Danford Bad,  MSN, CNM, FNP Certified Nurse Midwife [facility]  Catheryn Bacon 08/14/2018, 8:41 AM

## 2018-08-14 NOTE — Anesthesia Post-op Follow-up Note (Signed)
Anesthesia QCDR form completed.        

## 2018-08-14 NOTE — Anesthesia Postprocedure Evaluation (Signed)
Anesthesia Post Note  Patient: Anna Ferguson  Procedure(s) Performed: DILATATION AND EVACUATION (N/A Vagina ) LAPAROSCOPIC OVARIAN CYSTECTOMY (N/A Abdomen)  Patient location during evaluation: PACU Anesthesia Type: General Level of consciousness: awake and alert Pain management: pain level controlled Vital Signs Assessment: post-procedure vital signs reviewed and stable Respiratory status: spontaneous breathing and respiratory function stable Cardiovascular status: stable Anesthetic complications: no     Last Vitals:  Vitals:   08/14/18 1421 08/14/18 1645  BP: 112/73 104/62  Pulse: 63 70  Resp: 18 12  Temp: (!) 36.4 C 36.4 C  SpO2: 100% 100%    Last Pain:  Vitals:   08/14/18 1645  TempSrc:   PainSc: 0-No pain                 Harrol Novello K

## 2018-08-14 NOTE — Discharge Summary (Signed)
Gynecology Physician Postoperative Discharge Summary  Patient ID: Anna Ferguson MRN: [id number] DOB/AGE: 1986-06-19 32 y.o.  Admit Date: 08/10/2018 Discharge Date: 08/15/2018  Preoperative Diagnoses:  1. Profound anemia from chronic blood loss  2. Polysubstance abuse 3. Incomplete abortion 4. Ovarian cyst 5. Desired permanent sterilization  Procedures: Procedure(s) (LRB): DILATATION AND EVACUATION (N/A) LAPAROSCOPIC OVARIAN CYSTECTOMY (N/A) BILATERAL TUBAL LIGATION BLOOD TRANSFUSION 4UPRBC  CBC Latest Ref Rng & Units 08/13/2018 08/11/2018 08/11/2018  WBC 3.6 - 11.0 K/uL 4.6 - -  Hemoglobin 12.0 - 16.0 g/dL 9.0(L) 10.0(L) 7.0(L)  Hematocrit 35.0 - 47.0 % 27.1(L) 29.6(L) 21.6(L)  Platelets 150 - 440 K/uL 211 [facility] Course:  Anna Ferguson is a 32 y.o. [phone]  admitted chronic vaginal bleeding with profound anemia after incomplete abortion. She was also found to have a 5cm ovarian cyst on ultrasound. She was admitted, given 4u PRBCs, and found to be cocaine positive, delaying her surgery until she was substance-free.  She underwent the procedures as mentioned above, her operation was uncomplicated. For further details about surgery, please refer to the operative report. Patient had an uncomplicated postoperative course, and was kept overnight due to pain intolerance.  By time of discharge on HD 5, her pain was controlled on oral pain medications; she was ambulating, voiding without difficulty, tolerating regular diet and passing flatus. She was deemed stable for discharge to home.   Discharge Exam: Blood pressure 94/63, pulse (!) 59, temperature 98.1 F (36.7 C), temperature source Oral, resp. rate 18, height 5\' 8"  (1.727 m), weight 48.5 kg, last menstrual period 10/06/2017, SpO2 99 %, unknown if currently breastfeeding. General appearance: alert and no distress  Resp: clear to auscultation bilaterally, normal respiratory effort Cardio: regular rate and  rhythm  GI: soft, non-tender; bowel sounds normal; no masses, no organomegaly.  Incision: C/D/I, no erythema, no drainage noted Pelvic: scant blood on pad  Extremities: extremities normal, atraumatic, no cyanosis or edema and Homans sign is negative, no sign of DVT  Discharged Condition: Stable  Disposition:   Discharge Instructions    Diet - low sodium heart healthy   Complete by:  As directed    Increase activity slowly   Complete by:  As directed      Allergies as of 08/15/2018      Reactions   Gabapentin Other (See Comments)   seizures      Medication List    TAKE these medications   acetaminophen 500 MG tablet Commonly known as:  TYLENOL Take 2 tablets (1,000 mg total) by mouth every 6 (six) hours.   butalbital-acetaminophen-caffeine 50-325-40 MG tablet Commonly known as:  FIORICET, ESGIC Take 1 tablet by mouth 3 (three) times daily as needed.   ibuprofen 800 MG tablet Commonly known as:  ADVIL,MOTRIN Take 1 tablet (800 mg total) by mouth every 6 (six) hours.   oxyCODONE 5 MG immediate release tablet Commonly known as:  Oxy IR/ROXICODONE Take 1 tablet (5 mg total) by mouth every 4 (four) hours as needed for moderate pain.   ranitidine 150 MG capsule Commonly known as:  ZANTAC Take 150 mg by mouth 2 (two) times daily.      Follow-up Information    Ward, Honor Loh, MD Follow up in 2 week(s).   Specialty:  Obstetrics and Gynecology Contact information: [place] [address] [phone]           Signed:  [place] Attending [place] [facility]  [facility]

## 2018-08-14 NOTE — Discharge Instructions (Signed)
Discharge instructions:  Call office if you have any of the following: fever >101 F, chills, excessive vaginal bleeding, incision drainage or problems, leg pain or redness, or any other concerns.   Activity: Do not lift > 15 lbs for 6 weeks.  No intercourse or tampons for 2 weeks.  No driving until you are certain you can slam on the brakes.   You may feel some pain in your upper right abdomen/rib and right shoulder.  This is from the gas in the abdomen for surgery. This will subside over time, please be patient!  Take 800mg  Ibuprofen and 1000mg  Tylenol around the clock, every 6 hours for at least the first 3-5 days.  After this you can take as needed.  This will help decrease inflammation and promote healing.  The narcotics you'll take just as needed, as they just trick your brain into thinking its not in pain.    Please don't limit yourself in terms of routine activity.  You will be able to do most things, although they may take longer to do or be a little painful.  You can do it!  Don't be a hero, but don't be a wimp either!

## 2018-08-14 NOTE — H&P (Signed)
Preoperative History and Physical  Anna Ferguson is a 32 y.o. 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 Also found to have a 5cm ovarian cyst and desired permanent sterilization.  She is a former IV drug user, weaned recently off suboxone, and was positive for cocaine on admission.  She has been given 4 units PRBCs and kept inpatient to clear her drugs in preparation for surgery.  Proposed surgery: dilation and evacuation, laparoscopic ovarian cystectomy, and bilateral tubal ligation.  Past Medical History:  Diagnosis Date  . Cancer ([place])   . Miscarriage   . Scoliosis    Past Surgical History:  Procedure Laterality Date  . DILATION AND CURETTAGE OF UTERUS    . DILATION AND EVACUATION N/A 12/26/2017   Procedure: DILATATION AND EVACUATION;  Surgeon: Harlin Heys, MD;  Location: [facility] ORS;  Service: Gynecology;  Laterality: N/A;  . TONSILLECTOMY     OB History  Gravida Para Term Preterm AB Living  6 2 2   4 2   SAB TAB Ectopic Multiple Live Births  2       2    # Outcome Date GA Lbr Len/2nd Weight Sex Delivery Anes PTL Lv  6 Term 10/29/13 [redacted]w[redacted]d   F Vag-Spont   LIV  5 Term 01/16/11 [redacted]w[redacted]d   M Vag-Spont   LIV  4 AB           3 SAB           2 SAB           1 AB           Patient denies any other pertinent gynecologic issues.   No current facility-administered medications on file prior to encounter.    Current Outpatient Medications on File Prior to Encounter  Medication Sig Dispense Refill  . buprenorphine (SUBUTEX) 8 MG SUBL SL tablet DISSOLVE 1 TABLET UNDER THE TONGUE 2 TIMES DAILY  0  . ranitidine (ZANTAC) 150 MG capsule Take 150 mg by mouth 2 (two) times daily.  5  . Buprenorphine HCl-Naloxone HCl (SUBOXONE) 8-2 MG FILM Place 2 each under the tongue 2 (two) times daily.     . butalbital-acetaminophen-caffeine (FIORICET, ESGIC) 50-325-40 MG tablet Take 1 tablet by mouth 3 (three) times daily as  needed.  5   Allergies  Allergen Reactions  . Gabapentin Other (See Comments)    seizures    Social History:   reports that she has been smoking cigarettes. She has been smoking about 0.50 packs per day. She has never used smokeless tobacco. She reports that she does not drink alcohol or use drugs.  Family History  Problem Relation Age of Onset  . Cancer Neg Hx   . Diabetes Neg Hx   . Stroke Neg Hx   . Heart disease Neg Hx     Review of Systems: Noncontributory  PHYSICAL EXAM: Blood pressure 98/72, pulse (!) 56, temperature 98.1 F (36.7 C), temperature source Oral, resp. rate 20, height 5\' 8"  (1.727 m), weight 48.5 kg, last menstrual period 10/06/2017, SpO2 100 %, unknown if currently breastfeeding. General appearance - alert, well appearing, and in no distress Chest - clear to auscultation, no wheezes, rales or rhonchi, symmetric air entry Heart - normal rate and regular rhythm Abdomen - soft, nontender, nondistended, no masses or organomegaly Pelvic - deferred to OR Extremities - peripheral pulses normal, no pedal edema, no clubbing or cyanosis  Labs: Results for orders placed  or performed during the hospital encounter of 08/10/18 (from the past 336 hour(s))  Pregnancy, urine   Collection Time: 08/10/18  6:49 PM  Result Value Ref Range   Preg Test, Ur POSITIVE (A) NEGATIVE  hCG, quantitative, pregnancy   Collection Time: 08/10/18  9:25 PM  Result Value Ref Range   hCG, Beta Chain, Quant, S 178 (H) <5 mIU/mL  CBC   Collection Time: 08/10/18  9:25 PM  Result Value Ref Range   WBC 5.7 3.6 - 11.0 K/uL   RBC 2.83 (L) 3.80 - 5.20 MIL/uL   Hemoglobin 6.5 (L) 12.0 - 16.0 g/dL   HCT 20.6 (L) 35.0 - 47.0 %   MCV 72.9 (L) 80.0 - 100.0 fL   MCH 22.8 (L) 26.0 - 34.0 pg   MCHC 31.3 (L) 32.0 - 36.0 g/dL   RDW 26.8 (H) 11.5 - 14.5 %   Platelets 353 150 - 440 K/uL  Prepare RBC   Collection Time: 08/10/18 10:02 PM  Result Value Ref Range   Order Confirmation       DUPLICATE Performed at [facility], [address] [place], [place] [address]   Type and screen [place]   Collection Time: 08/10/18 10:06 PM  Result Value Ref Range   ABO/RH(D) A POS    Antibody Screen NEG    Sample Expiration 08/13/2018    Unit Number [id number]    Blood Component Type RED CELLS,LR    Unit division 00    Status of Unit ISSUED,FINAL    Transfusion Status OK TO TRANSFUSE    Crossmatch Result Compatible    Unit Number [id number]    Blood Component Type RED CELLS,LR    Unit division 00    Status of Unit ISSUED,FINAL    Transfusion Status OK TO TRANSFUSE    Crossmatch Result Compatible    Unit Number [id number]    Blood Component Type RED CELLS,LR    Unit division 00    Status of Unit ISSUED,FINAL    Transfusion Status OK TO TRANSFUSE    Crossmatch Result Compatible    Unit Number [id number]    Blood Component Type RED CELLS,LR    Unit division 00    Status of Unit ISSUED,FINAL    Transfusion Status OK TO TRANSFUSE    Crossmatch Result      Compatible Performed at [facility], [address], [place], [place] [address]   [facility]   Collection Time: 08/10/18 10:06 PM  Result Value Ref Range   ISSUE DATE / TIME [id number]    Blood Product Unit Number [id number]    PRODUCT CODE E0336V00    Unit Type and Rh 6200    Blood Product Expiration Date 532992426834    ISSUE DATE / TIME [id number]    Blood Product Unit Number [id number]    PRODUCT CODE [id number]    Unit Type and Rh 6200    Blood Product Expiration Date 702637858850    ISSUE DATE / TIME [id number]    Blood Product Unit Number [id number]    PRODUCT CODE [id number]    Unit Type and Rh 6200    Blood Product Expiration Date 650354656812    ISSUE DATE / TIME [id number]    Blood Product Unit Number [id number]    PRODUCT CODE L9357S17    Unit Type and Rh 6200    Blood Product Expiration Date 793903009233   Prepare RBC    Collection Time: 08/10/18 10:13 PM  Result Value Ref Range  Order Confirmation      ORDER PROCESSED BY BLOOD BANK Performed at [facility], [address] [place], [place] [address]   Urine Drug Screen, Qualitative [facility] only)   Collection Time: 08/11/18  9:40 AM  Result Value Ref Range   Tricyclic, Ur Screen NONE DETECTED NONE DETECTED   Amphetamines, Ur Screen NONE DETECTED NONE DETECTED   MDMA (Ecstasy)Ur Screen NONE DETECTED NONE DETECTED   Cocaine Metabolite,Ur [place] POSITIVE (A) NONE DETECTED   Opiate, Ur Screen NONE DETECTED NONE DETECTED   Phencyclidine (PCP) Ur S NONE DETECTED NONE DETECTED   Cannabinoid 50 Ng, Ur [place] NONE DETECTED NONE DETECTED   Barbiturates, Ur Screen NONE DETECTED NONE DETECTED   Benzodiazepine, Ur Scrn NONE DETECTED NONE DETECTED   Methadone Scn, Ur NONE DETECTED NONE DETECTED  Hemoglobin and hematocrit, blood   Collection Time: 08/11/18  9:59 AM  Result Value Ref Range   Hemoglobin 7.0 (L) 12.0 - 16.0 g/dL   HCT 21.6 (L) 35.0 - 47.0 %  Prepare RBC   Collection Time: 08/11/18 10:32 AM  Result Value Ref Range   Order Confirmation      ORDER PROCESSED BY BLOOD BANK Performed at [facility], [address] [place], [place] [address]   Hemoglobin and hematocrit, blood   Collection Time: 08/11/18  9:33 PM  Result Value Ref Range   Hemoglobin 10.0 (L) 12.0 - 16.0 g/dL   HCT 29.6 (L) 35.0 - 47.0 %  Urine Drug Screen, Qualitative ([facility] only)   Collection Time: 08/12/18  8:48 AM  Result Value Ref Range   Tricyclic, Ur Screen NONE DETECTED NONE DETECTED   Amphetamines, Ur Screen POSITIVE (A) NONE DETECTED   MDMA (Ecstasy)Ur Screen NONE DETECTED NONE DETECTED   Cocaine Metabolite,Ur [place] POSITIVE (A) NONE DETECTED   Opiate, Ur Screen NONE DETECTED NONE DETECTED   Phencyclidine (PCP) Ur S NONE DETECTED NONE DETECTED   Cannabinoid 50 Ng, Ur [place] NONE DETECTED NONE DETECTED   Barbiturates, Ur Screen NONE DETECTED NONE DETECTED    Benzodiazepine, Ur Scrn NONE DETECTED NONE DETECTED   Methadone Scn, Ur NONE DETECTED NONE DETECTED  CBC   Collection Time: 08/13/18  1:02 PM  Result Value Ref Range   WBC 4.6 3.6 - 11.0 K/uL   RBC 3.45 (L) 3.80 - 5.20 MIL/uL   Hemoglobin 9.0 (L) 12.0 - 16.0 g/dL   HCT 27.1 (L) 35.0 - 47.0 %   MCV 78.4 (L) 80.0 - 100.0 fL   MCH 26.1 26.0 - 34.0 pg   MCHC 33.3 32.0 - 36.0 g/dL   RDW 23.2 (H) 11.5 - 14.5 %   Platelets 211 150 - 440 K/uL  Urine Drug Screen, Qualitative ([facility] only)   Collection Time: 08/13/18  4:44 PM  Result Value Ref Range   Tricyclic, Ur Screen NONE DETECTED NONE DETECTED   Amphetamines, Ur Screen NONE DETECTED NONE DETECTED   MDMA (Ecstasy)Ur Screen NONE DETECTED NONE DETECTED   Cocaine Metabolite,Ur [place] NONE DETECTED NONE DETECTED   Opiate, Ur Screen NONE DETECTED NONE DETECTED   Phencyclidine (PCP) Ur S NONE DETECTED NONE DETECTED   Cannabinoid 50 Ng, Ur [place] NONE DETECTED NONE DETECTED   Barbiturates, Ur Screen NONE DETECTED NONE DETECTED   Benzodiazepine, Ur Scrn NONE DETECTED NONE DETECTED   Methadone Scn, Ur NONE DETECTED NONE DETECTED    Imaging Studies: US Ob Comp Less 14 Wks  Result Date: 08/10/2018 CLINICAL DATA:  32 year old female reports miscarriage 3 weeks ago, but still with heavy  bleeding. EXAM: OBSTETRIC <14 WK [place] AND TRANSVAGINAL OB US DOPPLER ULTRASOUND OF OVARIES TECHNIQUE: Both transabdominal and transvaginal ultrasound examinations were performed for complete evaluation of the gestation as well as the maternal uterus, adnexal regions, and pelvic cul-de-sac. Transvaginal technique was performed to assess early pregnancy. Color and duplex Doppler ultrasound was utilized to evaluate blood flow to the ovaries. COMPARISON:  Early Ob ultrasound 06/10/2018 FINDINGS: Intrauterine gestational sac: None Yolk sac:  None Embryo:  None Cardiac Activity: None Subchorionic hemorrhage:  Not applicable Maternal uterus/adnexae: Thickened, heterogeneous, and hyper  vascular endometrium (image 21) measuring up to 2.7 centimeters in thickness. The left ovary measures 2.9 x 2.2 x 2.7 centimeters and appears within normal limits. Preserved low resistance arterial and venous waveforms. The right ovary is enlarged measuring up to 5.7 x 3.5 x 4.3 centimeters and contains both a simple appearing cystic area measuring up to 3 centimeters and a complex 3.9 centimeter hypoechoic area with a reticular pattern of internal echoes. Neither of these demonstrates internal vascularity (image 53), but there are preserved low resistance arterial and venous waveforms elsewhere within the right ovary. There is a small amount of simple appearing free fluid adjacent to the left ovary. IMPRESSION: 1. Thickened, heterogeneous and hypervascular endometrium highly suspicious for Retained Products Of Conception. 2. Negative for ovarian torsion. 3. Positive for two right ovarian cysts with both simple and hemorrhagic cyst morphology. These measure 3 - 3.9 cm individually. Electronically Signed   By: Genevie Ann M.D.   On: 08/10/2018 20:37    Assessment: Patient Active Problem List   Diagnosis Date Noted  . Acute blood loss anemia 08/12/2018  . Acute post-hemorrhagic anemia 08/10/2018    Plan: Patient will undergo surgical management with D&E, laparoscopy with ovarian cystectomy and bilateral tubal ligation.   The risks of surgery were discussed in detail with the patient including but not limited to: bleeding which may require transfusion or reoperation; infection which may require antibiotics; injury to surrounding organs which may involve bowel, bladder, ureters ; need for additional procedures including laparoscopy or laparotomy; thromboembolic phenomenon, surgical site problems and other postoperative/anesthesia complications. Likelihood of success in alleviating the patient's condition was discussed. Routine postoperative instructions will be reviewed with the patient and her family in detail  after surgery.  The patient concurred with the proposed plan, giving informed written consent for the surgery.  Patient has been NPO since last night she will remain NPO for procedure.  Anesthesia and OR aware.  Preoperative prophylactic antibiotics and SCDs ordered on call to the OR.  To OR when ready.  ----- Larey Days, MD Attending Obstetrician and Gynecologist [facility], Department of [facility]

## 2018-08-15 ENCOUNTER — Encounter: Payer: Self-pay | Admitting: Obstetrics & Gynecology

## 2018-08-15 NOTE — Op Note (Signed)
Anna Ferguson PROCEDURE DATE: 08/14/2018  PATIENT:  Anna Ferguson  32 y.o. female  PRE-OPERATIVE DIAGNOSIS:  Incomplete Abortion, ovarian cyst, desired permanent sterilization, profound anemia  POST-OPERATIVE DIAGNOSIS: same  PROCEDURE:  Procedure(s): DILATATION AND EVACUATION (N/A) LAPAROSCOPIC OVARIAN CYSTECTOMY (N/A)  BILATERAL TUBAL LIGATION  SURGEON:  Surgeon(s) and Role:    * Kenroy Timberman, Honor Loh, MD - Primary  ANESTHESIA:  General via ET  I/O  See Anesthesia record   Urine out: 800cc, blood loss, 25cc  FINDINGS:   Evacuation: normal appearing cervix and vagina, products of conception    Small retroverted uterus, normal ovaries, right with 5cm cyst and normal fallopian tubes bilaterally.  Normal upper abdomen.  Moderate amount of free fluid in the abdomen.  SPECIMEN: 1. Products of conception 2. Portion of left tube, portion of right tube 3. Ovarian cyst (right)  COMPLICATIONS: none apparent  DISPOSITION: vital signs stable to PACU   Indication for Surgery: 32 y.o. [id number] who presented to the ED with continued vaginal bleeding after miscarriage 3 weeks ago, was found to have profound anemia with an initial Hemoglobin of 6.5, and received a total of 4 PRBCs with final Hb prior to surgery of 9.0, but was cocaine positive on admission.  She was kept in house for 3 days until her UDS was negative, and was brought back for surgery.  On admission her ultrasound showed thickened endometrium concerning for incomplete abortion, and a 5cm ovarian cyst with free fluid.  At the time of her admission, when surgery was discussed, she asked if she could have her tubes tied.  She repeated this request throughout her hospital stay, with several witnesses.  She was asked many times, and in no uncertain terms she requested permanent sterilization.  Risks of surgery were discussed with the patient including but not limited to: bleeding which may require transfusion or  reoperation; infection which may require antibiotics; injury to bowel, bladder, ureters or other surrounding organs; need for additional procedures including laparotomy, blood clot, incisional problems and other postoperative/anesthesia complications, and infertility. Written informed consent was obtained.     PROCEDURE IN DETAIL:  The patient had sequential compression devices applied to her lower extremities while in the preoperative area.  She was then taken to the operating room where general anesthesia was administered and was found to be adequate. IV doxycycline was given.  She was placed in the dorsal lithotomy position, and was prepped and draped in a sterile manner. A surgical time-out was called. A Foley catheter was inserted into her bladder and attached to constant drainage.  The graves speculum was placed in the vagina, and a single tooth tenaculum was placed on the posterior lip of the cervix.  The cervix was then dilated sequentially to accommodate an 8 french curved suction curette.  Under adequate suction, the contents of the uterus were removed with several passes.  A sharp curette was performed with a gritty texture noted in four quadrants.  The tenaculum was removed and after silver nitrate, the site hemostatic. After a change of gloves, the attention was turned to the abdomen where an umbilical incision was made with the scalpel. An 50mm trochar was inserted in the umbilical incision using a visiport method. Opening pressure was 68mmHg, and the abdomen was insufflated to 58mmHg carbon dioxide gas and adequate pneumoperitoneum was obtained.  A survey of the patient's pelvis and abdomen revealed the findings as mentioned above. Two 56mm ports were inserted in the lower left and right  quadrants under visualization.    The fimbriated ends of each bilateral tube were tented upwards as to avoid the IP ligaments and ovarian vessels.  The mesosalpinx was then ligated using the Ligasure, to the  proximal point of the tubal isthmus.  The portions of the tubes were removed and handed off to nursing.  The right ovary was then held in place and the Bovie hook was used to apply cautery to the ovarian surface surrounding the cyst.  The ovary was then undermined and the wall kept intact.  The cyst was then peeled off of the ovary and placed in an endocatch bag.  The cyst was ruptured in the bag and then brought through the 38mm trochar.  The hook was used to cauterize the inner ovarian stroma for hemostasis, which was observed. The pneumatic pressure was decreased to 61mmHg and no further bleeding was evident.  The 46mm trochar was removed and using the inlet closure device, the fascia was closed with 0 vicryl.  The abdomen and pelvis were surveyed, and found to be hemostatic.  No intraoperative injury to surrounding organs was noted.  The abdomen was desufflated and all instruments were then removed from the patient's abdomen.  All skin incisions were closed with 4-0 monocryl and covered with surgical glue. The patient tolerated the procedures well.  All instruments, needles, and sponge counts were correct x 2. The patient was taken to the recovery room in stable condition.   ---- Larey Days, MD Attending Obstetrician and [place], [facility]

## 2018-08-15 NOTE — Progress Notes (Signed)
Pt discharged home. Discharge instructions, prescriptions, and follow up appointments given to and reviewed with pt. Pt verbalized understanding. Pt refused wheelchair, NT walked with patient to her car.  Ammie Dalton, RN

## 2018-08-15 NOTE — Discharge Summary (Addendum)
Gynecology Physician Postoperative Discharge Summary  Patient ID: Anna Ferguson MRN: [id number] DOB/AGE: February 09, 1986 32 y.o.  Admit Date: 08/10/2018 Discharge Date: 08/15/2018  Preoperative Diagnoses: Incomplete AB, ovarian cyst, BTL, prefound anemia  Procedures: Procedure(s) (LRB): DILATATION AND EVACUATION (N/A) LAPAROSCOPIC OVARIAN CYSTECTOMY (N/A)  CBC Latest Ref Rng & Units 08/13/2018 08/11/2018 08/11/2018  WBC 3.6 - 11.0 K/uL 4.6 - -  Hemoglobin 12.0 - 16.0 g/dL 9.0(L) 10.0(L) 7.0(L)  Hematocrit 35.0 - 47.0 % 27.1(L) 29.6(L) 21.6(L)  Platelets 150 - 440 K/uL 211 [facility] Course:  Anna Ferguson is a 32 y.o. [id number]  admitted for scheduled surgery.  She underwent the procedures as mentioned above, her operation was uncomplicated. For further details about surgery, please refer to the operative report. Patient had an uncomplicated postoperative course. By time of discharge on POD#1, her pain was controlled on oral pain medications; she was ambulating, voiding without difficulty, tolerating regular diet and passing flatus. She was deemed stable for discharge to home.   Discharge Exam: Blood pressure 94/63, pulse (!) 59, temperature 98.1 F (36.7 C), temperature source Oral, resp. rate 18, height 5\' 8"  (1.727 m), weight 48.5 kg, last menstrual period 10/06/2017, SpO2 99 %, unknown if currently breastfeeding. General appearance: alert and no distress  Resp: clear to auscultation bilaterally, normal respiratory effort Cardio: regular rate and rhythm  GI: soft, non-tender; bowel sounds normal; no masses, no organomegaly.  Incision: C/D/I, no erythema, no drainage noted, palpable crepitus on the area around the Rt op site up to the RUQ to the midline and down to the pubic region, no tenseness, +audible BS present  Pelvic: scant blood on pad  Extremities: extremities normal, atraumatic, no cyanosis or edema and Homans sign is negative, no sign of  DVT  Discharged Condition: Stable  Disposition: Discharge disposition: 01-Home or Self Care       Discharge Instructions    Diet - low sodium heart healthy   Complete by:  As directed    Increase activity slowly   Complete by:  As directed      Allergies as of 08/15/2018      Reactions   Gabapentin Other (See Comments)   seizures      Medication List    TAKE these medications   acetaminophen 500 MG tablet Commonly known as:  TYLENOL Take 2 tablets (1,000 mg total) by mouth every 6 (six) hours.   butalbital-acetaminophen-caffeine 50-325-40 MG tablet Commonly known as:  FIORICET, ESGIC Take 1 tablet by mouth 3 (three) times daily as needed.   ibuprofen 800 MG tablet Commonly known as:  ADVIL,MOTRIN Take 1 tablet (800 mg total) by mouth every 6 (six) hours.   oxyCODONE 5 MG immediate release tablet Commonly known as:  Oxy IR/ROXICODONE Take 1 tablet (5 mg total) by mouth every 4 (four) hours as needed for moderate pain.   ranitidine 150 MG capsule Commonly known as:  ZANTAC Take 150 mg by mouth 2 (two) times daily.      Follow-up Information    Ward, Honor Loh, MD Follow up in 2 week(s).   Specialty:  Obstetrics and Gynecology Contact information: [place] [address] [phone]         [place] substance abuse website checked prior to administering narcotics  Signed:  Catheryn Bacon Attending [facility]

## 2018-08-17 LAB — HEPATITIS B SURFACE ANTIGEN: Hepatitis B Surface Ag: NEGATIVE

## 2018-08-19 LAB — SURGICAL PATHOLOGY

## 2020-06-28 IMAGING — US US OB COMP LESS 14 WK
1 series · 13 of 28 positions shown · non-contrast
Comparison: Early Ob ultrasound 06/10/2018

CLINICAL DATA: 32-year-old female reports miscarriage 3 weeks ago,
but still with heavy bleeding.



[Series 1: us ob comp less 14 wk · 0.13mm/px · 13 of 77 slices shown]
[im 3/77]
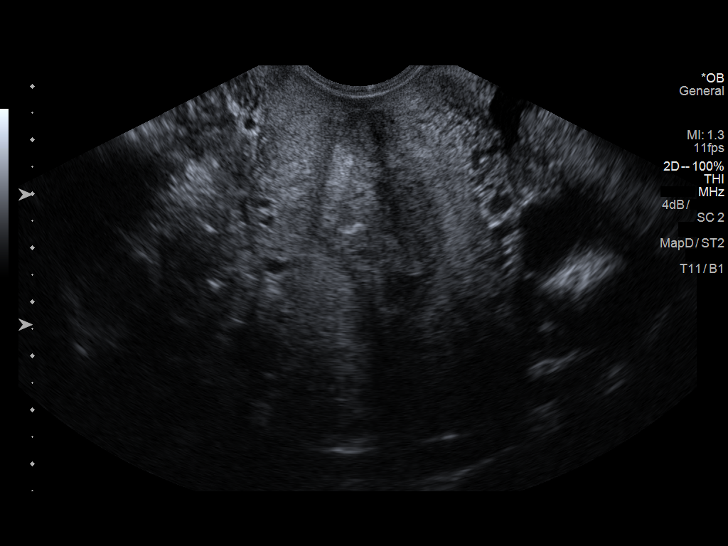
[im 9/77]
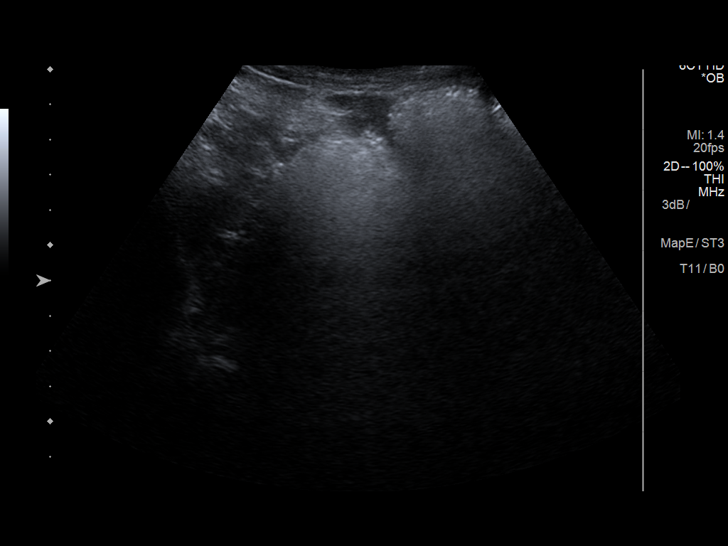
[im 15/77]
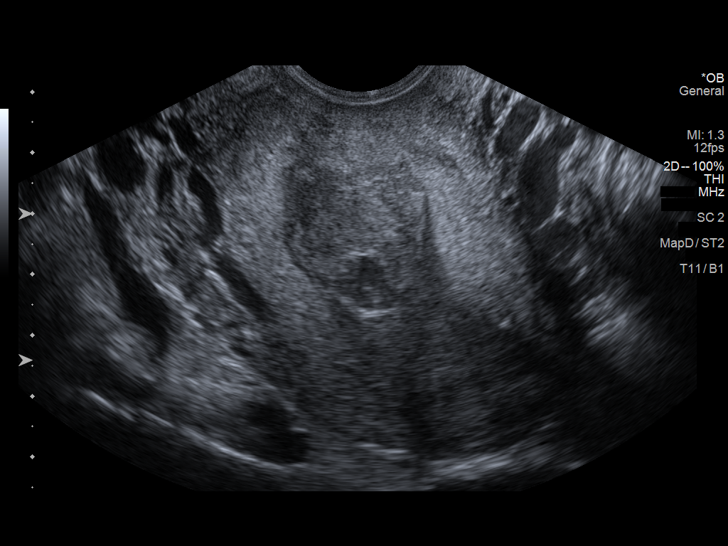
[im 20/77]
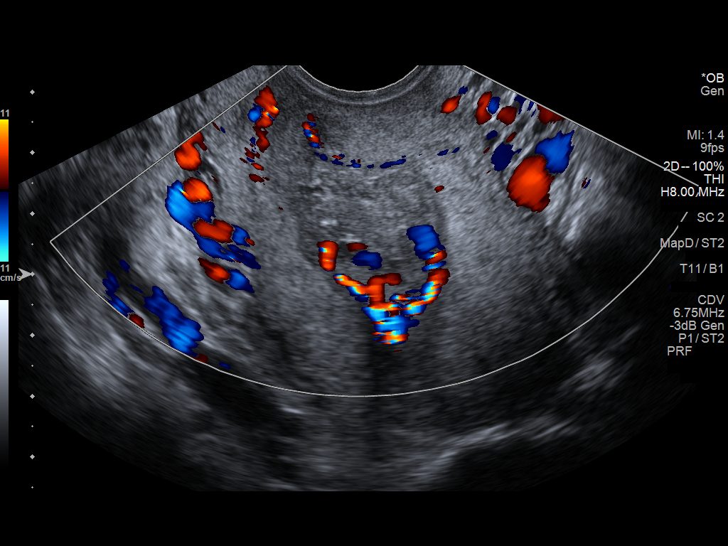
[im 26/77]
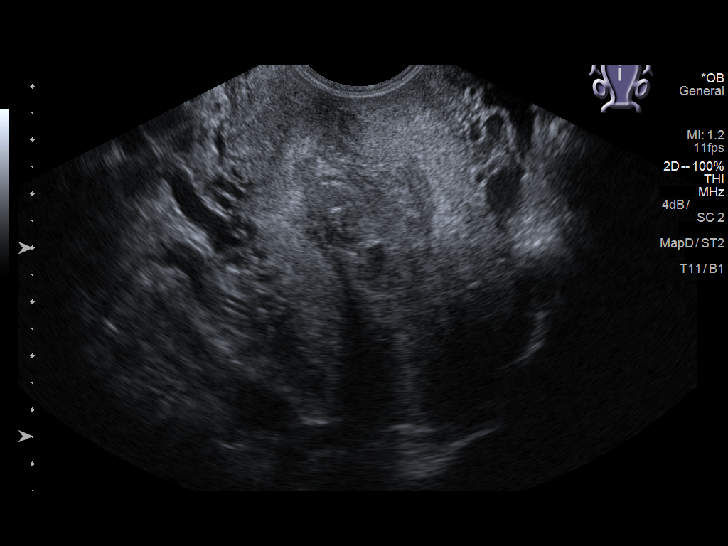
[im 31/77]
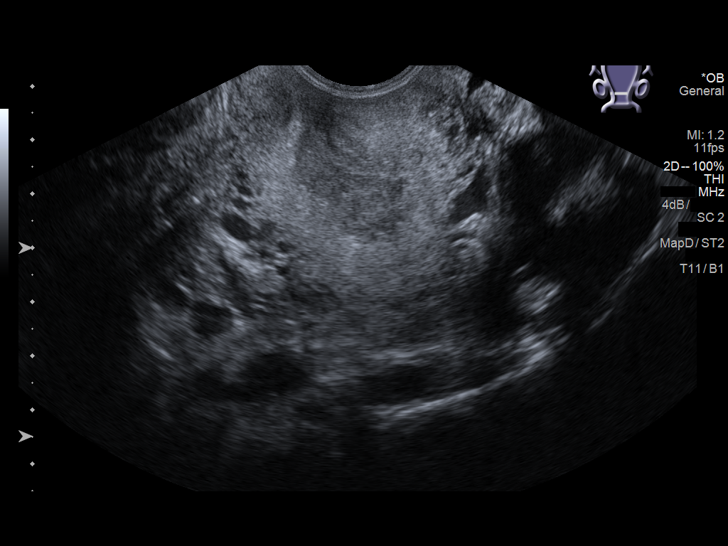
[im 40/77]
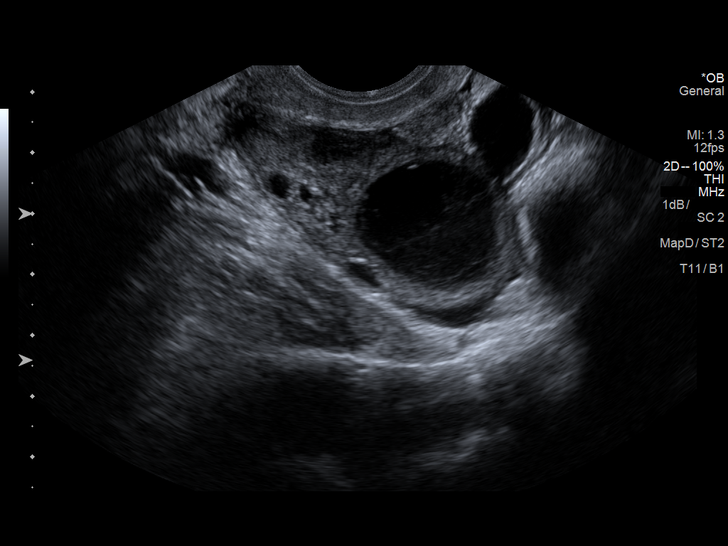
[im 46/77]
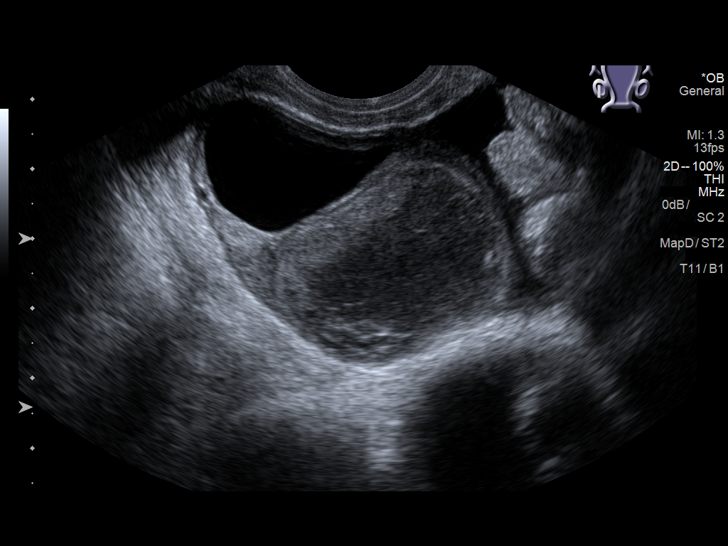
[im 51/77]
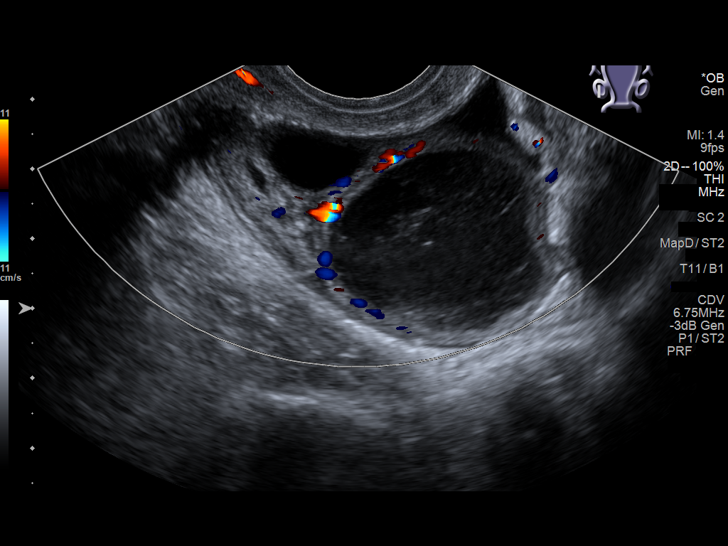
[im 57/77]
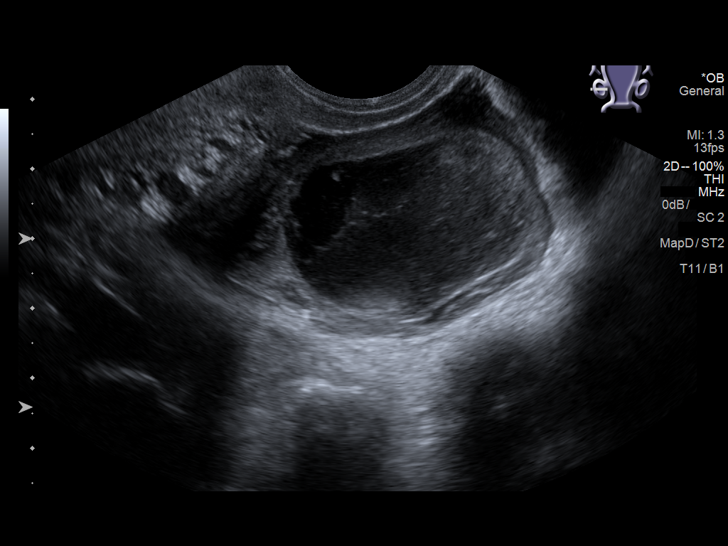
[im 62/77]
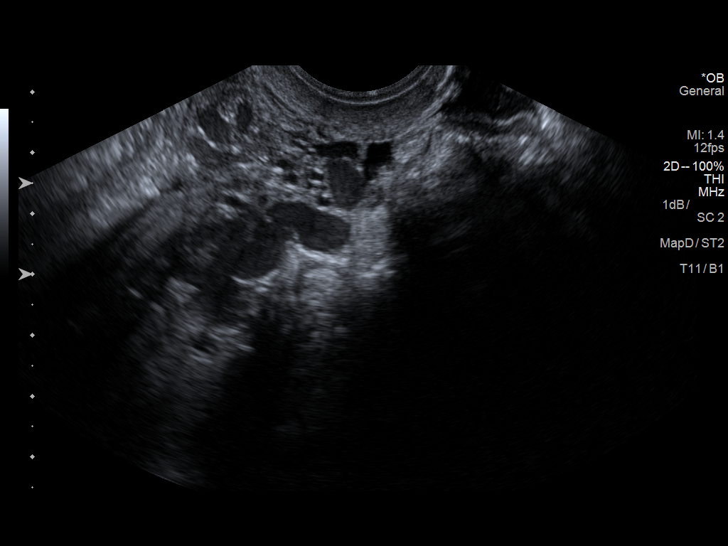
[im 68/77]
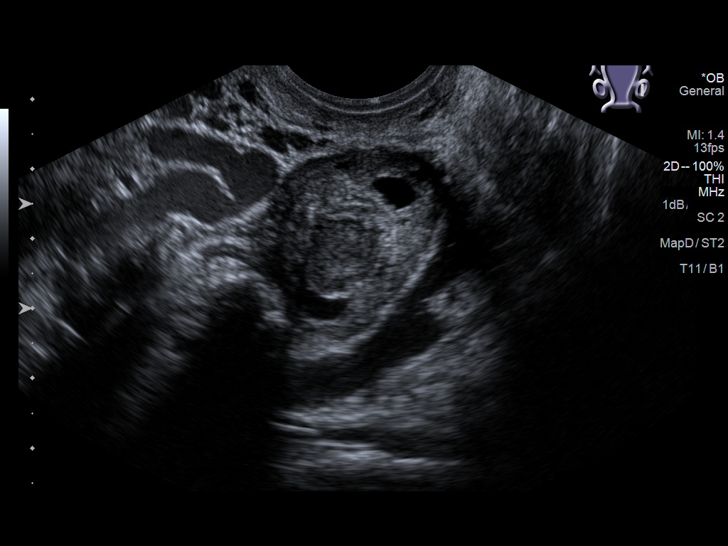
[im 74/77]
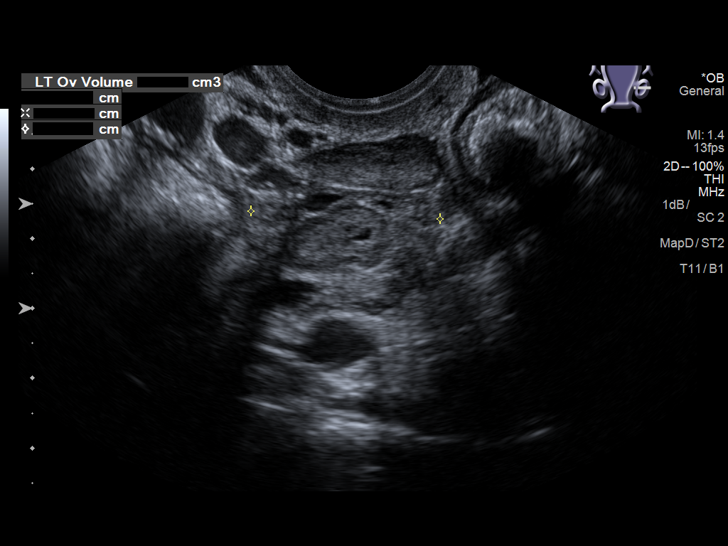

[13 of 28 positions shown; findings below may reference images not displayed]

FINDINGS: Intrauterine gestational sac: None

Yolk sac:  None

Embryo:  None

Cardiac Activity: None

Subchorionic hemorrhage:  Not applicable

Maternal uterus/adnexae:

Thickened, heterogeneous, and hyper vascular endometrium (image 21)
measuring up to 2.7 centimeters in thickness.

The left ovary measures 2.9 x 2.2 x 2.7 centimeters and appears
within normal limits. Preserved low resistance arterial and venous
waveforms.

The right ovary is enlarged measuring up to 5.7 x 3.5 x
centimeters and contains both a simple appearing cystic area
measuring up to 3 centimeters and a complex 3.9 centimeter
hypoechoic area with a reticular pattern of internal echoes. Neither
of these demonstrates internal vascularity (image 53), but there are
preserved low resistance arterial and venous waveforms elsewhere
within the right ovary.

There is a small amount of simple appearing free fluid adjacent to
the left ovary.
IMPRESSION: 1. Thickened, heterogeneous and hypervascular endometrium highly
suspicious for Retained Products Of Conception.
2. Negative for ovarian torsion.
3. Positive for two right ovarian cysts with both simple and
hemorrhagic cyst morphology. These measure 3 - 3.9 cm individually.

## 2023-03-31 ENCOUNTER — Encounter: Payer: Self-pay | Admitting: Emergency Medicine

## 2023-03-31 ENCOUNTER — Other Ambulatory Visit: Payer: Self-pay

## 2023-03-31 DIAGNOSIS — M419 Scoliosis, unspecified: Secondary | ICD-10-CM | POA: Diagnosis present

## 2023-03-31 DIAGNOSIS — F1910 Other psychoactive substance abuse, uncomplicated: Secondary | ICD-10-CM | POA: Diagnosis present

## 2023-03-31 DIAGNOSIS — E876 Hypokalemia: Secondary | ICD-10-CM | POA: Diagnosis present

## 2023-03-31 DIAGNOSIS — M84361A Stress fracture, right tibia, initial encounter for fracture: Secondary | ICD-10-CM | POA: Diagnosis present

## 2023-03-31 DIAGNOSIS — F17290 Nicotine dependence, other tobacco product, uncomplicated: Secondary | ICD-10-CM | POA: Diagnosis present

## 2023-03-31 DIAGNOSIS — F1210 Cannabis abuse, uncomplicated: Secondary | ICD-10-CM | POA: Diagnosis present

## 2023-03-31 DIAGNOSIS — F17210 Nicotine dependence, cigarettes, uncomplicated: Secondary | ICD-10-CM | POA: Diagnosis present

## 2023-03-31 DIAGNOSIS — L03115 Cellulitis of right lower limb: Principal | ICD-10-CM | POA: Diagnosis present

## 2023-03-31 NOTE — ED Triage Notes (Signed)
Pt to triage via w/c with no distress noted; reports x 4 days having bilat pain/swelling/redness to feet; denies any injury, denies hx of same, denies any accomp symptoms

## 2023-03-31 NOTE — ED Notes (Signed)
Lab contacted for assistance in obtaining labs at this time 

## 2023-04-01 ENCOUNTER — Inpatient Hospital Stay: Payer: Medicaid Other

## 2023-04-01 ENCOUNTER — Inpatient Hospital Stay
Admission: EM | Admit: 2023-04-01 | Discharge: 2023-04-03 | DRG: 603 | Discharge status: Against Medical Advice | Payer: Medicaid Other | Attending: Internal Medicine | Admitting: Internal Medicine

## 2023-04-01 ENCOUNTER — Encounter: Payer: Self-pay | Admitting: Family Medicine

## 2023-04-01 DIAGNOSIS — F1210 Cannabis abuse, uncomplicated: Secondary | ICD-10-CM | POA: Diagnosis present

## 2023-04-01 DIAGNOSIS — Z720 Tobacco use: Secondary | ICD-10-CM | POA: Diagnosis not present

## 2023-04-01 DIAGNOSIS — M419 Scoliosis, unspecified: Secondary | ICD-10-CM | POA: Diagnosis present

## 2023-04-01 DIAGNOSIS — E876 Hypokalemia: Secondary | ICD-10-CM | POA: Diagnosis present

## 2023-04-01 DIAGNOSIS — F17210 Nicotine dependence, cigarettes, uncomplicated: Secondary | ICD-10-CM | POA: Diagnosis present

## 2023-04-01 DIAGNOSIS — M25471 Effusion, right ankle: Secondary | ICD-10-CM | POA: Diagnosis not present

## 2023-04-01 DIAGNOSIS — F1910 Other psychoactive substance abuse, uncomplicated: Secondary | ICD-10-CM | POA: Diagnosis present

## 2023-04-01 DIAGNOSIS — M84361A Stress fracture, right tibia, initial encounter for fracture: Secondary | ICD-10-CM | POA: Diagnosis present

## 2023-04-01 DIAGNOSIS — L03115 Cellulitis of right lower limb: Principal | ICD-10-CM

## 2023-04-01 DIAGNOSIS — F17290 Nicotine dependence, other tobacco product, uncomplicated: Secondary | ICD-10-CM | POA: Diagnosis present

## 2023-04-01 LAB — URINALYSIS, ROUTINE W REFLEX MICROSCOPIC
Bilirubin Urine: NEGATIVE
Glucose, UA: NEGATIVE mg/dL
Ketones, ur: 20 mg/dL — AB
Nitrite: POSITIVE — AB
Protein, ur: 30 mg/dL — AB
Specific Gravity, Urine: 1.031 — ABNORMAL HIGH (ref 1.005–1.030)
WBC, UA: >50 WBC/hpf (ref 0–5)
pH: 5 (ref 5.0–8.0)

## 2023-04-01 LAB — CHLAMYDIA/NGC RT PCR (ARMC ONLY)
Chlamydia Tr: NOT DETECTED
N gonorrhoeae: NOT DETECTED

## 2023-04-01 LAB — URINE DRUG SCREEN, QUALITATIVE (ARMC ONLY)
Amphetamines, Ur Screen: POSITIVE — AB
Barbiturates, Ur Screen: NOT DETECTED
Benzodiazepine, Ur Scrn: NOT DETECTED
Cannabinoid 50 Ng, Ur ~~LOC~~: POSITIVE — AB
Cocaine Metabolite,Ur ~~LOC~~: NOT DETECTED
MDMA (Ecstasy)Ur Screen: POSITIVE — AB
Methadone Scn, Ur: NOT DETECTED
Opiate, Ur Screen: NOT DETECTED
Phencyclidine (PCP) Ur S: NOT DETECTED
Tricyclic, Ur Screen: NOT DETECTED

## 2023-04-01 LAB — POC URINE PREG, ED: Preg Test, Ur: NEGATIVE

## 2023-04-01 LAB — CBC
HCT: 39.7 % (ref 36.0–46.0)
Hemoglobin: 12.5 g/dL (ref 12.0–15.0)
MCH: 27.2 pg (ref 26.0–34.0)
MCHC: 31.5 g/dL (ref 30.0–36.0)
MCV: 86.3 fL (ref 80.0–100.0)
Platelets: 318 10*3/uL (ref 150–400)
RBC: 4.6 MIL/uL (ref 3.87–5.11)
RDW: 13.8 % (ref 11.5–15.5)
WBC: 7.2 10*3/uL (ref 4.0–10.5)
nRBC: 0 % (ref 0.0–0.2)

## 2023-04-01 LAB — CBC WITH DIFFERENTIAL/PLATELET
Abs Immature Granulocytes: 0.02 10*3/uL (ref 0.00–0.07)
Basophils Absolute: 0.1 10*3/uL (ref 0.0–0.1)
Basophils Relative: 1 %
Eosinophils Absolute: 0.1 10*3/uL (ref 0.0–0.5)
Eosinophils Relative: 1 %
HCT: 45.4 % (ref 36.0–46.0)
Hemoglobin: 14 g/dL (ref 12.0–15.0)
Immature Granulocytes: 0 %
Lymphocytes Relative: 13 %
Lymphs Abs: 1.2 10*3/uL (ref 0.7–4.0)
MCH: 26.8 pg (ref 26.0–34.0)
MCHC: 30.8 g/dL (ref 30.0–36.0)
MCV: 86.8 fL (ref 80.0–100.0)
Monocytes Absolute: 0.8 10*3/uL (ref 0.1–1.0)
Monocytes Relative: 8 %
Neutro Abs: 7.2 10*3/uL (ref 1.7–7.7)
Neutrophils Relative %: 77 %
Platelets: 331 10*3/uL (ref 150–400)
RBC: 5.23 MIL/uL — ABNORMAL HIGH (ref 3.87–5.11)
RDW: 13.9 % (ref 11.5–15.5)
WBC: 9.4 10*3/uL (ref 4.0–10.5)
nRBC: 0 % (ref 0.0–0.2)

## 2023-04-01 LAB — COMPREHENSIVE METABOLIC PANEL
ALT: 36 U/L (ref 0–44)
AST: 47 U/L — ABNORMAL HIGH (ref 15–41)
Albumin: 4.3 g/dL (ref 3.5–5.0)
Alkaline Phosphatase: 89 U/L (ref 38–126)
Anion gap: 14 (ref 5–15)
BUN: 12 mg/dL (ref 6–20)
CO2: 20 mmol/L — ABNORMAL LOW (ref 22–32)
Calcium: 9.2 mg/dL (ref 8.9–10.3)
Chloride: 101 mmol/L (ref 98–111)
Creatinine, Ser: 0.52 mg/dL (ref 0.44–1.00)
GFR, Estimated: >60 mL/min (ref >60)
Glucose, Bld: 94 mg/dL (ref 70–99)
Potassium: 3.5 mmol/L (ref 3.5–5.1)
Sodium: 135 mmol/L (ref 135–145)
Total Bilirubin: 1.6 mg/dL — ABNORMAL HIGH (ref 0.3–1.2)
Total Protein: 9 g/dL — ABNORMAL HIGH (ref 6.5–8.1)

## 2023-04-01 LAB — BASIC METABOLIC PANEL
Anion gap: 11 (ref 5–15)
BUN: 11 mg/dL (ref 6–20)
CO2: 21 mmol/L — ABNORMAL LOW (ref 22–32)
Calcium: 8.8 mg/dL — ABNORMAL LOW (ref 8.9–10.3)
Chloride: 103 mmol/L (ref 98–111)
Creatinine, Ser: 0.56 mg/dL (ref 0.44–1.00)
GFR, Estimated: >60 mL/min (ref >60)
Glucose, Bld: 91 mg/dL (ref 70–99)
Potassium: 3.4 mmol/L — ABNORMAL LOW (ref 3.5–5.1)
Sodium: 135 mmol/L (ref 135–145)

## 2023-04-01 LAB — LACTIC ACID, PLASMA
Lactic Acid, Venous: 1 mmol/L (ref 0.5–1.9)
Lactic Acid, Venous: 0.8 mmol/L (ref 0.5–1.9)

## 2023-04-01 LAB — SEDIMENTATION RATE: Sed Rate: 21 mm/hr — ABNORMAL HIGH (ref 0–20)

## 2023-04-01 LAB — C-REACTIVE PROTEIN: CRP: 6.3 mg/dL — ABNORMAL HIGH (ref <1.0)

## 2023-04-01 MED ORDER — GADOBUTROL 1 MMOL/ML IV SOLN
6.0000 mL | Freq: Once | INTRAVENOUS | Status: AC | PRN
  Administered 2023-04-01: 6 mL via INTRAVENOUS

## 2023-04-01 MED ORDER — MAGNESIUM HYDROXIDE 400 MG/5ML PO SUSP: 30.0000 mL | Freq: Every day | ORAL | Status: DC | PRN

## 2023-04-01 MED ORDER — POTASSIUM CHLORIDE IN NACL 20-0.9 MEQ/L-% IV SOLN
INTRAVENOUS | Status: DC
  Filled 2023-04-01 (×4): qty 1000

## 2023-04-01 MED ORDER — VANCOMYCIN HCL 1500 MG/300ML IV SOLN
1500.0000 mg | Freq: Once | INTRAVENOUS | Status: AC
  Administered 2023-04-01: 1500 mg via INTRAVENOUS
  Filled 2023-04-01: qty 300

## 2023-04-01 MED ORDER — POTASSIUM CHLORIDE CRYS ER 20 MEQ PO TBCR
20.0000 meq | EXTENDED_RELEASE_TABLET | Freq: Once | ORAL | Status: AC
  Administered 2023-04-01: 20 meq via ORAL
  Filled 2023-04-01: qty 1

## 2023-04-01 MED ORDER — SODIUM CHLORIDE 0.9 % IV SOLN: 1.0000 g | INTRAVENOUS | Status: DC

## 2023-04-01 MED ORDER — TRAZODONE HCL 50 MG PO TABS: 25.0000 mg | ORAL_TABLET | Freq: Every evening | ORAL | Status: DC | PRN

## 2023-04-01 MED ORDER — ONDANSETRON HCL 4 MG PO TABS: 4.0000 mg | ORAL_TABLET | Freq: Four times a day (QID) | ORAL | Status: DC | PRN

## 2023-04-01 MED ORDER — VANCOMYCIN HCL IN DEXTROSE 1-5 GM/200ML-% IV SOLN: 1000.0000 mg | Freq: Once | INTRAVENOUS | Status: DC

## 2023-04-01 MED ORDER — SODIUM CHLORIDE 0.9 % IV SOLN
1.0000 g | Freq: Once | INTRAVENOUS | Status: AC
  Administered 2023-04-01: 1 g via INTRAVENOUS
  Filled 2023-04-01: qty 10

## 2023-04-01 MED ORDER — KETOROLAC TROMETHAMINE 15 MG/ML IJ SOLN
15.0000 mg | Freq: Once | INTRAMUSCULAR | Status: AC
  Administered 2023-04-01: 15 mg via INTRAVENOUS
  Filled 2023-04-01: qty 1

## 2023-04-01 MED ORDER — ACETAMINOPHEN 650 MG RE SUPP: 650.0000 mg | Freq: Four times a day (QID) | RECTAL | Status: DC | PRN

## 2023-04-01 MED ORDER — VANCOMYCIN HCL 1750 MG/350ML IV SOLN: 1750.0000 mg | INTRAVENOUS | Status: DC

## 2023-04-01 MED ORDER — ACETAMINOPHEN 500 MG PO TABS
1000.0000 mg | ORAL_TABLET | Freq: Four times a day (QID) | ORAL | Status: DC
  Administered 2023-04-01 – 2023-04-03 (×8): 1000 mg via ORAL
  Filled 2023-04-01 (×10): qty 2

## 2023-04-01 MED ORDER — ENOXAPARIN SODIUM 40 MG/0.4ML IJ SOSY
40.0000 mg | PREFILLED_SYRINGE | INTRAMUSCULAR | Status: DC
  Administered 2023-04-01 – 2023-04-03 (×3): 40 mg via SUBCUTANEOUS
  Filled 2023-04-01 (×3): qty 0.4

## 2023-04-01 MED ORDER — SODIUM CHLORIDE 0.9 % IV BOLUS
500.0000 mL | Freq: Once | INTRAVENOUS | Status: AC
  Administered 2023-04-01: 500 mL via INTRAVENOUS

## 2023-04-01 MED ORDER — VANCOMYCIN HCL 750 MG/150ML IV SOLN
750.0000 mg | Freq: Two times a day (BID) | INTRAVENOUS | Status: DC
  Filled 2023-04-01: qty 150

## 2023-04-01 MED ORDER — KETOROLAC TROMETHAMINE 15 MG/ML IJ SOLN
15.0000 mg | Freq: Four times a day (QID) | INTRAMUSCULAR | Status: DC | PRN
  Administered 2023-04-01 – 2023-04-02 (×2): 15 mg via INTRAVENOUS
  Filled 2023-04-01 (×2): qty 1

## 2023-04-01 MED ORDER — ONDANSETRON HCL 4 MG/2ML IJ SOLN: 4.0000 mg | Freq: Four times a day (QID) | INTRAMUSCULAR | Status: DC | PRN

## 2023-04-01 MED ORDER — ACETAMINOPHEN 325 MG PO TABS: 650.0000 mg | ORAL_TABLET | Freq: Four times a day (QID) | ORAL | Status: DC | PRN

## 2023-04-01 MED ORDER — OXYCODONE-ACETAMINOPHEN 5-325 MG PO TABS
1.0000 | ORAL_TABLET | Freq: Four times a day (QID) | ORAL | Status: DC | PRN
  Administered 2023-04-01: 1 via ORAL
  Filled 2023-04-01 (×3): qty 1

## 2023-04-01 MED ORDER — MORPHINE SULFATE (PF) 2 MG/ML IV SOLN
1.0000 mg | INTRAVENOUS | Status: DC | PRN
  Administered 2023-04-01 – 2023-04-03 (×4): 1 mg via INTRAVENOUS
  Filled 2023-04-01 (×5): qty 1

## 2023-04-01 MED ORDER — CEFAZOLIN SODIUM-DEXTROSE 2-4 GM/100ML-% IV SOLN
2.0000 g | Freq: Three times a day (TID) | INTRAVENOUS | Status: DC
  Administered 2023-04-02 – 2023-04-03 (×5): 2 g via INTRAVENOUS
  Filled 2023-04-01 (×5): qty 100

## 2023-04-01 NOTE — Progress Notes (Signed)
PHARMACY -  BRIEF ANTIBIOTIC NOTE   Pharmacy has received consult(s) for Vancomycin from an ED provider.  The patient's profile has been reviewed for ht/wt/allergies/indication/available labs.    One time order(s) placed for Vancomycin 1500 mg IV X 1  Further antibiotics/pharmacy consults should be ordered by admitting physician if indicated.                       Thank you, Notnamed Scholz D 04/01/2023  2:39 AM

## 2023-04-01 NOTE — Assessment & Plan Note (Addendum)
-   This is extending to the foot with associated right foot cellulitis - She was admitted to a medical bed. - We will continue antibiotic therapy with IV Rocephin and vancomycin. - Warm compresses will be applied. - Pain management will be provided.

## 2023-04-01 NOTE — ED Provider Notes (Signed)
[facility] Provider Note    Event Date/Time   First MD Initiated Contact with Patient 04/01/23 0128     (approximate)   History   Leg Swelling   HPI  Anna Ferguson is a 37 y.o. female   Past medical history of former IV drug user who presents to the emergency department with right ankle pain and swelling and warmth.  Started with some minor redness and warmth to the right ankle but over the last 3 days has ballooned up to encompass the entire foot and has very much pain walking.  Some chills but no fever.  Now her left ankle started to hurt as well.  No obvious inciting event, trauma injury skin lesions.  No longer uses IV drugs. No history of gout or inflammatory joint problems  Independent Historian contributed to assessment above: Her boyfriend is at bedside to corroborate information given above     Physical Exam   Triage Vital Signs: ED Triage Vitals  Enc Vitals Group     BP 03/31/23 2316 132/85     Pulse Rate 03/31/23 2316 91     Resp 03/31/23 2316 20     Temp 03/31/23 2316 98.5 F (36.9 C)     Temp Source 03/31/23 2316 Oral     SpO2 03/31/23 2316 100 %     Weight 03/31/23 2304 135 lb (61.2 kg)     Height 03/31/23 2304 5\' 8"  (1.727 m)     Head Circumference --      Peak Flow --      Pain Score 03/31/23 2304 9     Pain Loc --      Pain Edu? --      Excl. in GC? --     Most recent vital signs: Vitals:   03/31/23 2316  BP: 132/85  Pulse: 91  Resp: 20  Temp: 98.5 F (36.9 C)  SpO2: 100%    General: Awake, no distress.  CV:  Good peripheral perfusion.  Resp:  Normal effort.  Abd:  No distention.  Other:  Markedly swollen red and warm right ankle circumferential and foot that is swollen as well.  Neurovascular intact.  Very difficult to range the ankle due to swelling and pain.  Redness does not extend past the ankle.  Lungs clear no murmurs.  No fever.   ED Results / Procedures / Treatments   Labs (all labs  ordered are listed, but only abnormal results are displayed) Labs Reviewed  CBC WITH DIFFERENTIAL/PLATELET - Abnormal; Notable for the following components:      Result Value   RBC 5.23 (*)    All other components within normal limits  COMPREHENSIVE METABOLIC PANEL - Abnormal; Notable for the following components:   CO2 20 (*)    Total Protein 9.0 (*)    AST 47 (*)    Total Bilirubin 1.6 (*)    All other components within normal limits  CHLAMYDIA/NGC RT PCR ([facility] ONLY)            CBC WITH DIFFERENTIAL/PLATELET  URINALYSIS, ROUTINE W REFLEX MICROSCOPIC  C-REACTIVE PROTEIN  SEDIMENTATION RATE  LACTIC ACID, PLASMA  LACTIC ACID, PLASMA  POC URINE PREG, ED     I ordered and reviewed the above labs they are notable for white blood cell count is normal     PROCEDURES:  Critical Care performed: No  Procedures   MEDICATIONS ORDERED IN ED: Medications  ketorolac (TORADOL) 15 MG/ML injection 15 mg (  has no administration in time range)  sodium chloride 0.9 % bolus 500 mL (has no administration in time range)  cefTRIAXone (ROCEPHIN) 1 g in sodium chloride 0.9 % 100 mL IVPB (has no administration in time range)    External physician / consultants:  I spoke with hospitalist for admission and regarding care plan for this patient.   IMPRESSION / MDM / ASSESSMENT AND PLAN / ED COURSE  I reviewed the triage vital signs and the nursing notes.                                Patient's presentation is most consistent with acute presentation with potential threat to life or bodily function.  Differential diagnosis includes, but is not limited to, cellulitis, joint infection, inflammatory joint pathology like gout or pseudogout   The patient is on the cardiac monitor to evaluate for evidence of arrhythmia and/or significant heart rate changes.  MDM: This patient with significant cellulitic changes from the ankle down to the foot and pain with ranging appears to be cellulitic versus  inflammatory joint or septic joint.  Fortunately her white blood cell count is normal she is afebrile nontoxic-appearing.  I will cover her with vancomycin and ceftriaxone via IV and admit her for cellulitis.  I hesitate to aspirate the joint given surrounding cellulitic changes overlying so I will order a MRI to further assess whether this is a joint infection.  Admission.        FINAL CLINICAL IMPRESSION(S) / ED DIAGNOSES   Final diagnoses:  Right ankle swelling  Cellulitis of right lower extremity     Rx / DC Orders   ED Discharge Orders     None        Note:  This document was prepared using Dragon voice recognition software and may include unintentional dictation errors.    Pilar Jarvis, MD 04/01/23 [phone]

## 2023-04-01 NOTE — Progress Notes (Signed)
Pharmacy Antibiotic Note  Anna Ferguson is a 37 y.o. female admitted on 04/01/2023 with cellulitis.  Pharmacy has been consulted for Vancomycin dosing.  Vancomycin 1500 mg IV X 1 given in ED on 5/7 @ 0327.  Plan: Change vancomycin to 750 mg IV every 12 hours Goal AUC 400-550 Estimated AUC 425, Cmin 11.4 Wt 61.2 kg, Scr 0.8 (rounded up from 0.56), Vd coefficient 0.72  Vancomycin levels at steady state or as clinically indicated  Height: 5\' 8"  (172.7 cm) Weight: 61.2 kg (135 lb) IBW/kg (Calculated) : 63.9  Temp (24hrs), Avg:98.2 F (36.8 C), Min:98 F (36.7 C), Max:98.5 F (36.9 C)  Recent Labs  Lab 03/31/23 2354 04/01/23 0226 04/01/23 0338  WBC 9.4  --  7.2  CREATININE 0.52  --  0.56  LATICACIDVEN  --  1.0  --      Estimated Creatinine Clearance: 91.2 mL/min (by C-G formula based on SCr of 0.56 mg/dL).    Allergies  Allergen Reactions   Gabapentin Other (See Comments)    seizures    Antimicrobials this admission:  Vancomycin 5/7 >>   Ceftriaxone x1 5/7 in ED   Dose adjustments this admission: Vancomycin 1750 mg q24h changed to 750 mg q12h  Microbiology results:   Thank you for allowing pharmacy to be a part of this patient's care.  Barrie Folk, PharmD 04/01/2023 10:27 AM

## 2023-04-01 NOTE — Consult Note (Signed)
ORTHOPAEDIC CONSULTATION  REQUESTING PHYSICIAN: Mansy, Vernetta Honey, MD  Chief Complaint:   Right ankle pain and swelling.  History of Present Illness: Anna Ferguson is a 37 y.o. female with a remote history of IV drug abuse but otherwise in good health who presented to the emergency room yesterday with a 4-day history of increased pain and swelling of her right ankle.  The patient denies any specific injury that may have contributed to the onset of her symptoms, nor does she recall any insect bites or other penetrating trauma.  The patient tried to manage her symptoms through the weekend by keeping her foot elevated and applying heat with limited benefit.  Because of difficulty bearing weight on her ankle/foot, she presented to the emergency room yesterday and subsequently was admitted for further evaluation and treatment.  Past Medical History:  Diagnosis Date   Cancer [facility])    Miscarriage    Scoliosis    Past Surgical History:  Procedure Laterality Date   DILATION AND CURETTAGE OF UTERUS     DILATION AND EVACUATION N/A 12/26/2017   Procedure: DILATATION AND EVACUATION;  Surgeon: Linzie Collin, MD;  Location: [facility] ORS;  Service: Gynecology;  Laterality: N/A;   DILATION AND EVACUATION N/A 08/14/2018   Procedure: DILATATION AND EVACUATION;  Surgeon: Ward, Elenora Fender, MD;  Location: [facility] ORS;  Service: Gynecology;  Laterality: N/A;   LAPAROSCOPIC OVARIAN CYSTECTOMY N/A 08/14/2018   Procedure: LAPAROSCOPIC OVARIAN CYSTECTOMY;  Surgeon: Ward, Elenora Fender, MD;  Location: [facility] ORS;  Service: Gynecology;  Laterality: N/A;   TONSILLECTOMY     Social History   Socioeconomic History   Marital status: Single    Spouse name: Not on file   Number of children: Not on file   Years of education: Not on file   Highest education level: Not on file  Occupational History   Not on file  Tobacco Use   Smoking status: Every Day     Packs/day: .5    Types: Cigarettes   Smokeless tobacco: Never  Vaping Use   Vaping Use: Every day  Substance and Sexual Activity   Alcohol use: No   Drug use: No   Sexual activity: Yes    Birth control/protection: None  Other Topics Concern   Not on file  Social History Narrative   Not on file   Social Determinants of Health   Financial Resource Strain: Not on file  Food Insecurity: No Food Insecurity (04/01/2023)   Hunger Vital Sign    Worried About Running Out of Food in the Last Year: Never true    Ran Out of Food in the Last Year: Never true  Transportation Needs: No Transportation Needs (04/01/2023)   PRAPARE - Administrator, Civil Service (Medical): No    Lack of Transportation (Non-Medical): No  Physical Activity: Not on file  Stress: Not on file  Social Connections: Not on file   Family History  Problem Relation Age of Onset   Cancer Neg Hx    Diabetes Neg Hx    Stroke Neg Hx    Heart disease Neg Hx    Allergies  Allergen Reactions   Gabapentin Other (See Comments)    seizures   Prior to Admission medications   Medication Sig Start Date End Date Taking? Authorizing Provider  acetaminophen (TYLENOL) 500 MG tablet Take 2 tablets (1,000 mg total) by mouth every 6 (six) hours. 08/14/18  Yes Ward, Elenora Fender, MD   MR ANKLE RIGHT W  WO CONTRAST  Result Date: 04/01/2023 CLINICAL DATA:  Pain and swelling the ankle and foot. EXAM: MRI OF THE RIGHT ANKLE WITHOUT AND WITH CONTRAST TECHNIQUE: Multiplanar, multisequence MR imaging of the ankle was performed before and after the administration of intravenous contrast. CONTRAST:  6mL GADAVIST GADOBUTROL 1 MMOL/ML IV SOLN COMPARISON:  None Available. FINDINGS: TENDONS Peroneal: Intact.  No tendinopathy or tenosynovitis. Posteromedial: Intact. Mild tenosynovitis involving the posterior tibialis tendon. Anterior: Intact.  No tendinopathy or tenosynovitis. Achilles: Normal Plantar Fascia: Intact.  No findings for plantar  fasciitis. LIGAMENTS Lateral: Intact Medial: Intact CARTILAGE Ankle Joint: There is a moderate-sized ankle joint effusion but normal appearing rim like synovial enhancement after contrast administration. The articular cartilage is intact. No findings suspicious for septic arthritis. Subtalar Joints/Sinus Tarsi: The subtalar joints are maintained. Normal sinus tarsi. Bones: Marked edema like signal changes involving the distal tibia along with diffuse enhancement after contrast administration. There is a distal tibial stress fracture mainly running horizontally across the metaphysis but extending down into the medial malleolus. No obvious periosteal reaction. There is surrounding soft tissue swelling/inflammation and enhancement. Other: Diffuse subcutaneous soft tissue swelling/edema without significant enhancement. No subcutaneous abscess. No findings for myofasciitis or pyomyositis. IMPRESSION: IMPRESSION 1. Distal tibial stress fracture. 2. Moderate-sized ankle joint effusion but no definite MR findings to suggest septic arthritis. There is normal rim like synovial enhancement and normal appearing articular cartilage. There is no enhancement or edema involving the adjacent talus. Recommend correlation with clinical findings. Joint aspiration would be an option if strong clinical suspicion. 3. Diffuse subcutaneous soft tissue swelling/edema but no significant enhancement or subcutaneous abscess. No findings for myofasciitis or pyomyositis. Electronically Signed   By: Rudie Meyer M.D.   On: 04/01/2023 08:46    Positive ROS: All other systems have been reviewed and were otherwise negative with the exception of those mentioned in the HPI and as above.  Physical Exam: General:  Alert, no acute distress Psychiatric:  Patient is competent for consent with normal mood and affect   Cardiovascular:  No pedal edema Respiratory:  No wheezing, non-labored breathing GI:  Abdomen is soft and non-tender Skin:  No  lesions in the area of chief complaint Neurologic:  Sensation intact distally Lymphatic:  No axillary or cervical lymphadenopathy  Orthopedic Exam:  Orthopedic examination is limited to the right foot and lower leg.  Skin inspection is notable for moderate swelling, warmth, and erythema involving the entire foot and extending into the distal tibial region.  She has moderate tenderness diffusely over this area.  She is able to actively dorsiflex and plantarflex her toes, as well as her ankle, elbow although with some discomfort.  Sensation is slightly diminished subjectively to light touch but present in all distributions.  She has good capillary refill to her right foot.  X-rays:  A recent MRI scan of the right foot and ankle is available for review and has been reviewed by myself.  The findings are as described above.  Assessment: 1.  Distal right tibial stress fracture. 2.  Right ankle effusion without clear evidence of infectious process. 3.  Right foot/ankle cellulitis.  Plan: The treatment options have been discussed with the patient.  I do feel that the patient does have a cellulitis present in the right foot and ankle which is being managed appropriately with IV antibiotics.  She also may have a urinary tract infection given her urinalysis results from earlier today.  Although there is evidence of fluid in the joint, the  MRI does not suggest evidence for a clear-cut septic joint.  Given the surrounding cellulitis, I am therefore hesitant to aspirate the joint through the cellulitic area for fear of drawing infection into the joint.    At this point, the patient is afebrile and not septic.  Furthermore, her white count is within normal limits and her sedimentation rate is only slightly elevated.  Therefore, I feel it is reasonable to watch her for the next 24 to 48 hours to see how her right ankle responds to the IV antibiotics.  If her symptoms worsen or certainly fail to improve, she might  actually benefit from an arthroscopic irrigation and debridement rather than going through the intermediate step of joint aspiration.  The patient is comfortable with this plan.  Thank you for asking me to participate in the care of this most pleasant and unfortunate woman.  I will be happy to follow her with you.   Maryagnes Amos, MD  Beeper #:  [phone]  04/01/2023 6:09 PM

## 2023-04-01 NOTE — H&P (Signed)
[place]   PATIENT NAME: Anna Ferguson    MR#:  [id number]  DATE OF BIRTH:  May 08, 1986  DATE OF ADMISSION:  04/01/2023  PRIMARY CARE PHYSICIAN: Pcp, No   Patient is coming from: Home  REQUESTING/REFERRING PHYSICIAN: Delton Prairie, MD  CHIEF COMPLAINT:   Chief Complaint  Patient presents with   Leg Swelling    HISTORY OF PRESENT ILLNESS:  Anna Ferguson is a 37 y.o. female with medical history significant for ongoing tobacco abuse and former IV drug abuse, who presented to the emergency room with acute onset of worsening right ankle pain with swelling, erythema and warmth, with subsequent difficulty with ambulation.  She denies any fever but has been having chills.  No chest pain or palpitations.  No dyspnea or cough or wheezing.  No nausea or vomiting or abdominal pain.  No dysuria, oliguria or hematuria or flank pain.  ED Course: When she came to the ER, vital signs were within normal.  Labs revealed borderline potassium of 3.5 and a CO2 of 20, AST 47 and troponin of 9 with albumin of 4.3 and total bili 1.6.  CBC was unremarkable.  Sed rate was 21. Imaging: MRI of the right ankle and foot is currently pending.  The patient was given IV vancomycin and Rocephin as well as 50 mg of IV Toradol and 500 and IV normal saline bolus.  She will be admitted to a medical bed for further evaluation and management. PAST MEDICAL HISTORY:   Past Medical History:  Diagnosis Date   Cancer [facility])    Miscarriage    Scoliosis     PAST SURGICAL HISTORY:   Past Surgical History:  Procedure Laterality Date   DILATION AND CURETTAGE OF UTERUS     DILATION AND EVACUATION N/A 12/26/2017   Procedure: DILATATION AND EVACUATION;  Surgeon: Linzie Collin, MD;  Location: [facility] ORS;  Service: Gynecology;  Laterality: N/A;   DILATION AND EVACUATION N/A 08/14/2018   Procedure: DILATATION AND EVACUATION;  Surgeon: Ward, Elenora Fender, MD;  Location: [facility] ORS;  Service:  Gynecology;  Laterality: N/A;   LAPAROSCOPIC OVARIAN CYSTECTOMY N/A 08/14/2018   Procedure: LAPAROSCOPIC OVARIAN CYSTECTOMY;  Surgeon: Ward, Elenora Fender, MD;  Location: [facility] ORS;  Service: Gynecology;  Laterality: N/A;   TONSILLECTOMY      SOCIAL HISTORY:   Social History   Tobacco Use   Smoking status: Every Day    Packs/day: .5    Types: Cigarettes   Smokeless tobacco: Never  Substance Use Topics   Alcohol use: No    FAMILY HISTORY:   Family History  Problem Relation Age of Onset   Cancer Neg Hx    Diabetes Neg Hx    Stroke Neg Hx    Heart disease Neg Hx     DRUG ALLERGIES:   Allergies  Allergen Reactions   Gabapentin Other (See Comments)    seizures    REVIEW OF SYSTEMS:   ROS As per history of present illness. All pertinent systems were reviewed above. Constitutional, HEENT, cardiovascular, respiratory, GI, GU, musculoskeletal, neuro, psychiatric, endocrine, integumentary and hematologic systems were reviewed and are otherwise negative/unremarkable except for positive findings mentioned above in the HPI.   MEDICATIONS AT HOME:   Prior to Admission medications   Medication Sig Start Date End Date Taking? Authorizing Provider  acetaminophen (TYLENOL) 500 MG tablet Take 2 tablets (1,000 mg total) by mouth every 6 (six) hours. 08/14/18  Yes Ward, Elenora Fender, MD  VITAL SIGNS:  Blood pressure 132/85, pulse 91, temperature 98 F (36.7 C), resp. rate 20, height 5\' 8"  (1.727 m), weight 61.2 kg, last menstrual period 03/31/2023, SpO2 100 %, unknown if currently breastfeeding.  PHYSICAL EXAMINATION:  Physical Exam  GENERAL:  37 y.o.-year-old female patient lying in the bed with no acute distress.  EYES: Pupils equal, round, reactive to light and accommodation. No scleral icterus. Extraocular muscles intact.  HEENT: Head atraumatic, normocephalic. Oropharynx and nasopharynx clear.  NECK:  Supple, no jugular venous distention. No thyroid enlargement, no tenderness.   LUNGS: Normal breath sounds bilaterally, no wheezing, rales,rhonchi or crepitation. No use of accessory muscles of respiration.  CARDIOVASCULAR: Regular rate and rhythm, S1, S2 normal. No murmurs, rubs, or gallops.  ABDOMEN: Soft, nondistended, nontender. Bowel sounds present. No organomegaly or mass.  EXTREMITIES: She has right more than left pitting ankle edema with no cyanosis, or clubbing.  NEUROLOGIC: Cranial nerves II through XII are intact. Muscle strength 5/5 in all extremities. Sensation intact. Gait not checked.  PSYCHIATRIC: The patient is alert and oriented x 3.  Normal affect and good eye contact. SKIN: Right ankle and foot swelling with erythema, warmth and significant tenderness without fluctuation.   LABORATORY PANEL:   CBC Recent Labs  Lab 04/01/23 0338  WBC 7.2  HGB 12.5  HCT 39.7  PLT 318   ------------------------------------------------------------------------------------------------------------------  Chemistries  Recent Labs  Lab 03/31/23 2354 04/01/23 0338  NA 135 135  K 3.5 3.4*  CL 101 103  CO2 20* 21*  GLUCOSE 94 91  BUN 12 11  CREATININE 0.52 0.56  CALCIUM 9.2 8.8*  AST 47*  --   ALT 36  --   ALKPHOS 89  --   BILITOT 1.6*  --    ------------------------------------------------------------------------------------------------------------------  Cardiac Enzymes No results for input(s): "TROPONINI" in the last 168 hours. ------------------------------------------------------------------------------------------------------------------  RADIOLOGY:  No results found.    IMPRESSION AND PLAN:  Assessment and Plan: * Cellulitis of right ankle - This is extending to the foot with associated right foot cellulitis - She was admitted to a medical bed. - We will continue antibiotic therapy with IV Rocephin and vancomycin. - Warm compresses will be applied. - Pain management will be provided.  Tobacco abuse - She was counseled for smoking  cessation and will receive further counseling here.   DVT prophylaxis: Lovenox.  Advanced Care Planning:  Code Status: full code.  Family Communication:  The plan of care was discussed in details with the patient (and family). I answered all questions. The patient agreed to proceed with the above mentioned plan. Further management will depend upon hospital course. Disposition Plan: Back to previous home environment Consults called: none.  All the records are reviewed and case discussed with ED provider.  Status is: Inpatient   At the time of the admission, it appears that the appropriate admission status for this patient is inpatient.  This is judged to be reasonable and necessary in order to provide the required intensity of service to ensure the patient's safety given the presenting symptoms, physical exam findings and initial radiographic and laboratory data in the context of comorbid conditions.  The patient requires inpatient status due to high intensity of service, high risk of further deterioration and high frequency of surveillance required.  I certify that at the time of admission, it is my clinical judgment that the patient will require inpatient hospital care extending more than 2 midnights.  Dispo: The patient is from: Home              Anticipated d/c is to: Home              Patient currently is not medically stable to d/c.              Difficult to place patient: No  Hannah Beat M.D on 04/01/2023 at 6:09 AM  [facility]   From 7 PM-7 AM, contact night-coverage [email or web address]  CC: Primary care physician; Pcp, No

## 2023-04-01 NOTE — ED Notes (Signed)
Assumed care from Abbey, RN. Pt resting comfortably in bed at this time. Pt denies any current needs or questions. Call light with in reach.   

## 2023-04-01 NOTE — Consult Note (Signed)
NAME: Anna Ferguson  DOB: 1986-10-18  MRN: [id number]  Date/Time: 04/01/2023 3:00 PM  REQUESTING PROVIDER: Dr.Williams Subjective:  REASON FOR CONSULT: celluliits vs septic arthritis ? Anna Ferguson is a 37 y.o. female with a past h/o  IVDA presents with painful swelling of rt foot for 4 days- She denies recent trauma, no fever  03/31/23  BP 132/85  Temp 98.5 F (36.9 C)  Pulse Rate 91  Resp 20  SpO2 100 %    Latest Reference Range & Units 03/31/23  WBC 4.0 - 10.5 K/uL 9.4  Hemoglobin 12.0 - 15.0 g/dL 62.9  HCT 52.8 - 41.3 % 45.4  Platelets 150 - 400 K/uL 331  Creatinine 0.44 - 1.00 mg/dL 2.44  MRI of the foot showed ankle effusion, distal tibial stress fracture Started on IV antibiotics vanco/ceftriaxone I am asked to see the patient for the same reason    Past Medical History:  Diagnosis Date   Cancer (HCC)    Miscarriage    Scoliosis     Past Surgical History:  Procedure Laterality Date   DILATION AND CURETTAGE OF UTERUS     DILATION AND EVACUATION N/A 12/26/2017   Procedure: DILATATION AND EVACUATION;  Surgeon: Linzie Collin, MD;  Location: [facility];  Service: Gynecology;  Laterality: N/A;   DILATION AND EVACUATION N/A 08/14/2018   Procedure: DILATATION AND EVACUATION;  Surgeon: Ward, Elenora Fender, MD;  Location: [facility];  Service: Gynecology;  Laterality: N/A;   LAPAROSCOPIC OVARIAN CYSTECTOMY N/A 08/14/2018   Procedure: LAPAROSCOPIC OVARIAN CYSTECTOMY;  Surgeon: Ward, Elenora Fender, MD;  Location: [facility];  Service: Gynecology;  Laterality: N/A;   TONSILLECTOMY      Social History   Socioeconomic History   Marital status: Single    Spouse name: Not on file   Number of children: Not on file   Years of education: Not on file   Highest education level: Not on file  Occupational History   Not on file  Tobacco Use   Smoking status: Every Day    Packs/day: .5    Types: Cigarettes   Smokeless tobacco: Never  Vaping Use   Vaping Use:  Every day  Substance and Sexual Activity   Alcohol use: No   Drug use: No   Sexual activity: Yes    Birth control/protection: None  Other Topics Concern   Not on file  Social History Narrative   Not on file   Social Determinants of Health   Financial Resource Strain: Not on file  Food Insecurity: No Food Insecurity (04/01/2023)   Hunger Vital Sign    Worried About Running Out of Food in the Last Year: Never true    Ran Out of Food in the Last Year: Never true  Transportation Needs: No Transportation Needs (04/01/2023)   PRAPARE - Administrator, Civil Service (Medical): No    Lack of Transportation (Non-Medical): No  Physical Activity: Not on file  Stress: Not on file  Social Connections: Not on file  Intimate Partner Violence: Not At Risk (04/01/2023)   Humiliation, Afraid, Rape, and Kick questionnaire    Fear of Current or Ex-Partner: No    Emotionally Abused: No    Physically Abused: No    Sexually Abused: No    Family History  Problem Relation Age of Onset   Cancer Neg Hx    Diabetes Neg Hx    Stroke Neg Hx    Heart disease Neg Hx  Allergies  Allergen Reactions   Gabapentin Other (See Comments)    seizures   I? Current Facility-Administered Medications  Medication Dose Route Frequency Provider Last Rate Last Admin   0.9 % NaCl with KCl 20 mEq/ L  infusion   Intravenous Continuous Charise Killian, MD 75 mL/hr at 04/01/23 0922 Rate Change at 04/01/23 0981   acetaminophen (TYLENOL) tablet 650 mg  650 mg Oral Q6H PRN Mansy, Jan A, MD       Or   acetaminophen (TYLENOL) suppository 650 mg  650 mg Rectal Q6H PRN Mansy, Jan A, MD       acetaminophen (TYLENOL) tablet 1,000 mg  1,000 mg Oral Q6H Mansy, Jan A, MD   1,000 mg at 04/01/23 0922   [START ON 04/02/2023] cefTRIAXone (ROCEPHIN) 1 g in sodium chloride 0.9 % 100 mL IVPB  1 g Intravenous Q24H Charise Killian, MD       enoxaparin (LOVENOX) injection 40 mg  40 mg Subcutaneous Q24H Mansy, Jan A, MD   40  mg at 04/01/23 0746   ketorolac (TORADOL) 15 MG/ML injection 15 mg  15 mg Intravenous Q6H PRN Mansy, Jan A, MD   15 mg at 04/01/23 0455   magnesium hydroxide (MILK OF MAGNESIA) suspension 30 mL  30 mL Oral Daily PRN Mansy, Jan A, MD       morphine (PF) 2 MG/ML injection 1 mg  1 mg Intravenous Q4H PRN Charise Killian, MD       ondansetron [facility]) tablet 4 mg  4 mg Oral Q6H PRN Mansy, Jan A, MD       Or   ondansetron [facility]) injection 4 mg  4 mg Intravenous Q6H PRN Mansy, Vernetta Honey, MD       oxyCODONE-acetaminophen (PERCOCET/ROXICET) 5-325 MG per tablet 1 tablet  1 tablet Oral Q6H PRN Charise Killian, MD       traZODone (DESYREL) tablet 25 mg  25 mg Oral QHS PRN Mansy, Vernetta Honey, MD       vancomycin (VANCOREADY) IVPB 750 mg/150 mL  750 mg Intravenous Q12H Barrie Folk, [facility]       Current Outpatient Medications  Medication Sig Dispense Refill   acetaminophen (TYLENOL) 500 MG tablet Take 2 tablets (1,000 mg total) by mouth every 6 (six) hours. 90 tablet 0     Abtx:  Anti-infectives (From admission, onward)    Start     Dose/Rate Route Frequency Ordered Stop   04/02/23 0300  vancomycin (VANCOREADY) IVPB 1750 mg/350 mL  Status:  Discontinued        1,750 mg 175 mL/hr over 120 Minutes Intravenous Every 24 hours 04/01/23 0413 04/01/23 1027   04/02/23 0000  cefTRIAXone (ROCEPHIN) 1 g in sodium chloride 0.9 % 100 mL IVPB        1 g 200 mL/hr over 30 Minutes Intravenous Every 24 hours 04/01/23 1038     04/01/23 1530  vancomycin (VANCOREADY) IVPB 750 mg/150 mL        750 mg 150 mL/hr over 60 Minutes Intravenous Every 12 hours 04/01/23 1027     04/01/23 0400  vancomycin (VANCOCIN) IVPB 1000 mg/200 mL premix  Status:  Discontinued        1,000 mg 200 mL/hr over 60 Minutes Intravenous  Once 04/01/23 0349 04/01/23 0400   04/01/23 0230  cefTRIAXone (ROCEPHIN) 1 g in sodium chloride 0.9 % 100 mL IVPB        1 g 200 mL/hr over 30 Minutes Intravenous  Once 04/01/23 0215 04/01/23 0338    04/01/23 0230  vancomycin (VANCOREADY) IVPB 1500 mg/300 mL        1,500 mg 150 mL/hr over 120 Minutes Intravenous  Once 04/01/23 0226 04/01/23 0455       REVIEW OF SYSTEMS:  Const: negative fever, negative chills, negative weight loss Eyes: negative diplopia or visual changes, negative eye pain ENT: negative coryza, negative sore throat Resp: negative cough, hemoptysis, dyspnea Cards: negative for chest pain, palpitations, lower extremity edema GU: negative for frequency, dysuria and hematuria GI: Negative for abdominal pain, diarrhea, bleeding, constipation Skin: negative for rash and pruritus Heme: negative for easy bruising and gum/nose bleeding MS: pain , swelling of rt ankle and inability to stand on the foot Neurolo:negative for headaches, dizziness, vertigo, memory problems  Psych: negative for feelings of anxiety, depression  Endocrine: negative for thyroid, diabetes Allergy/Immunology- gabapentin Objective:  VITALS:  BP 99/62 (BP Location: Right Arm)   Pulse 73   Temp 98 F (36.7 C) (Oral)   Resp 14   Ht 5\' 8"  (1.727 m)   Wt 61.2 kg   LMP 03/31/2023 (Exact Date)   SpO2 96%   BMI 20.53 kg/m   PHYSICAL EXAM:  General: Alert, cooperative, no distress, appears stated age.  Head: Normocephalic, without obvious abnormality, atraumatic. Eyes: Conjunctivae clear, anicteric sclerae. Pupils are equal ENT Nares normal. No drainage or sinus tenderness. Lips, mucosa, and tongue normal. No Thrush Neck: Supple, symmetrical, no adenopathy, thyroid: non tender no carotid bruit and no JVD. Back: No CVA tenderness. Lungs: Clear to auscultation bilaterally. No Wheezing or Rhonchi. No rales. Heart: Regular rate and rhythm, no murmur, rub or gallop. Abdomen: Soft, non-tender,not distended. Bowel sounds normal. No masses Extremities: rt ankle and foot swollen, erythematous and tender Some swelling left ankle       Skin: No rashes or lesions. Or bruising Lymph: Cervical,  supraclavicular normal. Neurologic: Grossly non-focal Pertinent Labs Lab Results CBC    Component Value Date/Time   WBC 7.2 04/01/2023 0338   RBC 4.60 04/01/2023 0338   HGB 12.5 04/01/2023 0338   HGB 15.9 06/23/2013 1046   HCT 39.7 04/01/2023 0338   HCT 47.9 (H) 06/23/2013 1046   PLT 318 04/01/2023 0338   PLT 286 06/23/2013 1046   MCV 86.3 04/01/2023 0338   MCV 85 06/23/2013 1046   MCH 27.2 04/01/2023 0338   MCHC 31.5 04/01/2023 0338   RDW 13.8 04/01/2023 0338   RDW 13.2 06/23/2013 1046   LYMPHSABS 1.2 03/31/2023 2354   LYMPHSABS 1.5 03/21/2012 2303   MONOABS 0.8 03/31/2023 2354   MONOABS 1.3 (H) 03/21/2012 2303   EOSABS 0.1 03/31/2023 2354   EOSABS 0.2 03/21/2012 2303   BASOSABS 0.1 03/31/2023 2354   BASOSABS 0.0 03/21/2012 2303       Latest Ref Rng & Units 04/01/2023    3:38 AM 03/31/2023   11:54 PM 12/24/2017    1:00 AM  CMP  Glucose 70 - 99 mg/dL 91  94  161   BUN 6 - 20 mg/dL 11  12  5    Creatinine 0.44 - 1.00 mg/dL 0.96  0.45  4.09   Sodium 135 - 145 mmol/L 135  135  134   Potassium 3.5 - 5.1 mmol/L 3.4  3.5  3.6   Chloride 98 - 111 mmol/L 103  101  102   CO2 22 - 32 mmol/L 21  20  24    Calcium 8.9 - 10.3 mg/dL 8.8  9.2  9.3  Total Protein 6.5 - 8.1 g/dL  9.0  7.9   Total Bilirubin 0.3 - 1.2 mg/dL  1.6  0.6   Alkaline Phos 38 - 126 U/L  89  43   AST 15 - 41 U/L  47  20   ALT 0 - 44 U/L  36  10       Microbiology:   IMAGING RESULTS: MRI reviewed Rt ankle effusion Distal tibial stress fracture rt I have personally reviewed the films ? Impression/Recommendation ?rt ankle /foot erythema,  and painful swelling.stress fracture in MRI Is this cellulitis VS stress fractre VS septic arthritis- Unlikely septic arthritis' DC vanco/ceftriaxone- start cefazolin as it could be cellulitis  Polysubstance use- urine tox positive for crystal meth, ecstasy and marijuana- pt denied using the first 2   ? ? ___________________________________________________ Discussed with patient, requesting provider Note:  This document was prepared using Dragon voice recognition software and may include unintentional dictation errors.

## 2023-04-01 NOTE — Plan of Care (Signed)
  Problem: Clinical Measurements: Goal: Ability to maintain clinical measurements within normal limits will improve Outcome: Progressing Goal: Will remain free from infection Outcome: Progressing   Problem: Pain Managment: Goal: General experience of comfort will improve Outcome: Progressing   Problem: Safety: Goal: Ability to remain free from injury will improve Outcome: Progressing   Problem: Skin Integrity: Goal: Risk for impaired skin integrity will decrease Outcome: Progressing   

## 2023-04-01 NOTE — Assessment & Plan Note (Signed)
She was counseled for smoking cessation and will receive further counseling here. 

## 2023-04-01 NOTE — ED Notes (Signed)
Pt to MRI

## 2023-04-01 NOTE — Progress Notes (Signed)
PROGRESS NOTE   HPI was taken from Dr. Arville Care: Anna Ferguson is a 37 y.o. female with medical history significant for ongoing tobacco abuse and former IV drug abuse, who presented to the emergency room with acute onset of worsening right ankle pain with swelling, erythema and warmth, with subsequent difficulty with ambulation.  She denies any fever but has been having chills.  No chest pain or palpitations.  No dyspnea or cough or wheezing.  No nausea or vomiting or abdominal pain.  No dysuria, oliguria or hematuria or flank pain.   ED Course: When she came to the ER, vital signs were within normal.  Labs revealed borderline potassium of 3.5 and a CO2 of 20, AST 47 and troponin of 9 with albumin of 4.3 and total bili 1.6.  CBC was unremarkable.  Sed rate was 21. Imaging: MRI of the right ankle and foot is currently pending.   The patient was given IV vancomycin and Rocephin as well as 50 mg of IV Toradol and 500 and IV normal saline bolus.  She will be admitted to a medical bed for further evaluation and management.   Rashel Finkley  VHQ:[id number] DOB: 08/26/1986 DOA: 04/01/2023 PCP: Pcp, No   Assessment & Plan:   Principal Problem:   Cellulitis of right ankle Active Problems:   Tobacco abuse  Assessment and Plan: Cellulitis of right ankle vs septic arthritis: continue on IV rocephin, vanco. ID consulted. Continue w/ supportive care   Distal tibial stress fracture: as per MRI. Percocet & morphine prn. Ortho surg consulted   Tobacco abuse: received smoking cessation counseling already   Hypokalemia: potassium given   Illicit drug use: urine drug screen positive for amphetamines, MDMA, & marijuana. Pt denies "shooting up any drugs."  DVT prophylaxis: lovenox  Code Status: full  Family Communication:  Disposition Plan: likely d/c back home .  Level of care: Med-Surg  Status is: Inpatient Remains inpatient appropriate because: severity of  illness   Consultants:  Ortho surg  ID  Procedures:   Antimicrobials: vanco, rocephin    Subjective: Pt c/o b/l ankle pain   Objective: Vitals:   03/31/23 2304 03/31/23 2316 04/01/23 0400 04/01/23 0749  BP:  132/85  108/69  Pulse:  91  88  Resp:  20  16  Temp:  98.5 F (36.9 C) 98 F (36.7 C) 98.1 F (36.7 C)  TempSrc:  Oral    SpO2:  100%  99%  Weight: 61.2 kg     Height: 5\' 8"  (1.727 m)       Intake/Output Summary (Last 24 hours) at 04/01/2023 0901 Last data filed at 04/01/2023 0810 Gross per 24 hour  Intake 933.33 ml  Output 400 ml  Net 533.33 ml   Filed Weights   03/31/23 2304  Weight: 61.2 kg    Examination:  General exam: Appears calm but uncomfortable  Respiratory system: Clear to auscultation. Respiratory effort normal. Cardiovascular system: S1 & S2 +. No  rubs, gallops or clicks.  Gastrointestinal system: Abdomen is nondistended, soft and nontender.  Normal bowel sounds heard. Central nervous system: Alert and oriented. Moves all extremities  Psychiatry: Judgement and insight appear normal. Flat mood and affect    Data Reviewed: I have personally reviewed following labs and imaging studies  CBC: Recent Labs  Lab 03/31/23 2354 04/01/23 0338  WBC 9.4 7.2  NEUTROABS 7.2  --   HGB 14.0 12.5  HCT 45.4 39.7  MCV 86.8 86.3  PLT 331 318  Basic Metabolic Panel: Recent Labs  Lab 03/31/23 2354 04/01/23 0338  NA 135 135  K 3.5 3.4*  CL 101 103  CO2 20* 21*  GLUCOSE 94 91  BUN 12 11  CREATININE 0.52 0.56  CALCIUM 9.2 8.8*   GFR: Estimated Creatinine Clearance: 91.2 mL/min (by C-G formula based on SCr of 0.56 mg/dL). Liver Function Tests: Recent Labs  Lab 03/31/23 2354  AST 47*  ALT 36  ALKPHOS 89  BILITOT 1.6*  PROT 9.0*  ALBUMIN 4.3   No results for input(s): "LIPASE", "AMYLASE" in the last 168 hours. No results for input(s): "AMMONIA" in the last 168 hours. Coagulation Profile: No results for input(s): "INR", "PROTIME" in  the last 168 hours. Cardiac Enzymes: No results for input(s): "CKTOTAL", "CKMB", "CKMBINDEX", "TROPONINI" in the last 168 hours. BNP (last 3 results) No results for input(s): "PROBNP" in the last 8760 hours. HbA1C: No results for input(s): "HGBA1C" in the last 72 hours. CBG: No results for input(s): "GLUCAP" in the last 168 hours. Lipid Profile: No results for input(s): "CHOL", "HDL", "LDLCALC", "TRIG", "CHOLHDL", "LDLDIRECT" in the last 72 hours. Thyroid Function Tests: No results for input(s): "TSH", "T4TOTAL", "FREET4", "T3FREE", "THYROIDAB" in the last 72 hours. Anemia Panel: No results for input(s): "VITAMINB12", "FOLATE", "FERRITIN", "TIBC", "IRON", "RETICCTPCT" in the last 72 hours. Sepsis Labs: Recent Labs  Lab 04/01/23 0226  LATICACIDVEN 1.0    No results found for this or any previous visit (from the past 240 hour(s)).       Radiology Studies: MR ANKLE RIGHT W WO CONTRAST  Result Date: 04/01/2023 CLINICAL DATA:  Pain and swelling the ankle and foot. EXAM: MRI OF THE RIGHT ANKLE WITHOUT AND WITH CONTRAST TECHNIQUE: Multiplanar, multisequence MR imaging of the ankle was performed before and after the administration of intravenous contrast. CONTRAST:  6mL GADAVIST GADOBUTROL 1 MMOL/ML IV SOLN COMPARISON:  None Available. FINDINGS: TENDONS Peroneal: Intact.  No tendinopathy or tenosynovitis. Posteromedial: Intact. Mild tenosynovitis involving the posterior tibialis tendon. Anterior: Intact.  No tendinopathy or tenosynovitis. Achilles: Normal Plantar Fascia: Intact.  No findings for plantar fasciitis. LIGAMENTS Lateral: Intact Medial: Intact CARTILAGE Ankle Joint: There is a moderate-sized ankle joint effusion but normal appearing rim like synovial enhancement after contrast administration. The articular cartilage is intact. No findings suspicious for septic arthritis. Subtalar Joints/Sinus Tarsi: The subtalar joints are maintained. Normal sinus tarsi. Bones: Marked edema like  signal changes involving the distal tibia along with diffuse enhancement after contrast administration. There is a distal tibial stress fracture mainly running horizontally across the metaphysis but extending down into the medial malleolus. No obvious periosteal reaction. There is surrounding soft tissue swelling/inflammation and enhancement. Other: Diffuse subcutaneous soft tissue swelling/edema without significant enhancement. No subcutaneous abscess. No findings for myofasciitis or pyomyositis. IMPRESSION: IMPRESSION 1. Distal tibial stress fracture. 2. Moderate-sized ankle joint effusion but no definite MR findings to suggest septic arthritis. There is normal rim like synovial enhancement and normal appearing articular cartilage. There is no enhancement or edema involving the adjacent talus. Recommend correlation with clinical findings. Joint aspiration would be an option if strong clinical suspicion. 3. Diffuse subcutaneous soft tissue swelling/edema but no significant enhancement or subcutaneous abscess. No findings for myofasciitis or pyomyositis. Electronically Signed   By: Rudie Meyer M.D.   On: 04/01/2023 08:46        Scheduled Meds:  acetaminophen  1,000 mg Oral Q6H   enoxaparin (LOVENOX) injection  40 mg Subcutaneous Q24H   Continuous Infusions:  0.9 % NaCl with  KCl 20 mEq / L 100 mL/hr at 04/01/23 0500   [START ON 04/02/2023] vancomycin       LOS: 0 days    Time spent: 35 mins     Charise Killian, MD [facility] Pager [phone]  If 7PM-7AM, please contact night-coverage [email or web address]  04/01/2023, 9:01 AM

## 2023-04-01 NOTE — Progress Notes (Signed)
Pharmacy Antibiotic Note  Anna Ferguson is a 37 y.o. female admitted on 04/01/2023 with cellulitis.  Pharmacy has been consulted for Vancomycin dosing.  Plan: Vancomycin 1500 mg IV X 1 given in ED on 5/7 @ 0327. Vancomycin 1750 mg IV Q24H ordered to start on 5/8 @ 0300.   AUC = 495.8 Vanc trough = 8.0   Height: 5\' 8"  (172.7 cm) Weight: 61.2 kg (135 lb) IBW/kg (Calculated) : 63.9  Temp (24hrs), Avg:98.5 F (36.9 C), Min:98.5 F (36.9 C), Max:98.5 F (36.9 C)  Recent Labs  Lab 03/31/23 2354 04/01/23 0226  WBC 9.4  --   CREATININE 0.52  --   LATICACIDVEN  --  1.0    Estimated Creatinine Clearance: 91.2 mL/min (by C-G formula based on SCr of 0.52 mg/dL).    Allergies  Allergen Reactions   Gabapentin Other (See Comments)    seizures    Antimicrobials this admission:   >>    >>   Dose adjustments this admission:   Microbiology results:  BCx:   UCx:    Sputum:    MRSA PCR:   Thank you for allowing pharmacy to be a part of this patient's care.  Aveya Beal D 04/01/2023 4:13 AM

## 2023-04-02 ENCOUNTER — Encounter: Payer: Self-pay | Admitting: Family Medicine

## 2023-04-02 DIAGNOSIS — L03115 Cellulitis of right lower limb: Secondary | ICD-10-CM | POA: Diagnosis not present

## 2023-04-02 LAB — COMPREHENSIVE METABOLIC PANEL
ALT: 24 U/L (ref 0–44)
AST: 29 U/L (ref 15–41)
Albumin: 2.9 g/dL — ABNORMAL LOW (ref 3.5–5.0)
Alkaline Phosphatase: 63 U/L (ref 38–126)
Anion gap: 4 — ABNORMAL LOW (ref 5–15)
BUN: 6 mg/dL (ref 6–20)
CO2: 22 mmol/L (ref 22–32)
Calcium: 8.2 mg/dL — ABNORMAL LOW (ref 8.9–10.3)
Chloride: 111 mmol/L (ref 98–111)
Creatinine, Ser: 0.48 mg/dL (ref 0.44–1.00)
GFR, Estimated: >60 mL/min (ref >60)
Glucose, Bld: 103 mg/dL — ABNORMAL HIGH (ref 70–99)
Potassium: 3.9 mmol/L (ref 3.5–5.1)
Sodium: 137 mmol/L (ref 135–145)
Total Bilirubin: 0.8 mg/dL (ref 0.3–1.2)
Total Protein: 6 g/dL — ABNORMAL LOW (ref 6.5–8.1)

## 2023-04-02 LAB — CBC
HCT: 36.1 % (ref 36.0–46.0)
Hemoglobin: 11.3 g/dL — ABNORMAL LOW (ref 12.0–15.0)
MCH: 26.8 pg (ref 26.0–34.0)
MCHC: 31.3 g/dL (ref 30.0–36.0)
MCV: 85.5 fL (ref 80.0–100.0)
Platelets: 252 10*3/uL (ref 150–400)
RBC: 4.22 MIL/uL (ref 3.87–5.11)
RDW: 13.9 % (ref 11.5–15.5)
WBC: 3.4 10*3/uL — ABNORMAL LOW (ref 4.0–10.5)
nRBC: 0 % (ref 0.0–0.2)

## 2023-04-02 LAB — CHLAMYDIA/NGC RT PCR (ARMC ONLY)
Chlamydia Tr: NOT DETECTED
N gonorrhoeae: NOT DETECTED

## 2023-04-02 LAB — HIV ANTIBODY (ROUTINE TESTING W REFLEX): HIV Screen 4th Generation wRfx: NONREACTIVE

## 2023-04-02 NOTE — Progress Notes (Signed)
Date of Admission:  04/01/2023   Total days of antibiotics ***        Day ***        Day ***        Day ***   ID: Anna Ferguson is a 37 y.o. female with  *** Principal Problem:   Cellulitis of right ankle Active Problems:   Tobacco abuse    Subjective: ***  Medications:   acetaminophen  1,000 mg Oral Q6H   enoxaparin (LOVENOX) injection  40 mg Subcutaneous Q24H    Objective: Vital signs in last 24 hours: Patient Vitals for the past 24 hrs:  BP Temp Temp src Pulse Resp SpO2  04/02/23 0755 103/62 97.7 F (36.5 C) -- 75 15 100 %  04/01/23 2336 (!) 108/58 98 F (36.7 C) -- 71 14 99 %  04/01/23 1555 93/61 98.1 F (36.7 C) Oral 72 18 99 %     LDA Foley Central lines Other catheters  PHYSICAL EXAM:  General: Alert, cooperative, no distress, appears stated age.  Head: Normocephalic, without obvious abnormality, atraumatic. Eyes: Conjunctivae clear, anicteric sclerae. Pupils are equal ENT Nares normal. No drainage or sinus tenderness. Lips, mucosa, and tongue normal. No Thrush Neck: Supple, symmetrical, no adenopathy, thyroid: non tender no carotid bruit and no JVD. Back: No CVA tenderness. Lungs: Clear to auscultation bilaterally. No Wheezing or Rhonchi. No rales. Heart: Regular rate and rhythm, no murmur, rub or gallop. Abdomen: Soft, non-tender,not distended. Bowel sounds normal. No masses Extremities: atraumatic, no cyanosis. No edema. No clubbing Skin: No rashes or lesions. Or bruising Lymph: Cervical, supraclavicular normal. Neurologic: Grossly non-focal  Lab Results    Latest Ref Rng & Units 04/02/2023    8:04 AM 04/01/2023    3:38 AM 03/31/2023   11:54 PM  CBC  WBC 4.0 - 10.5 K/uL 3.4  7.2  9.4   Hemoglobin 12.0 - 15.0 g/dL 40.9  81.1  91.4   Hematocrit 36.0 - 46.0 % 36.1  39.7  45.4   Platelets 150 - 400 K/uL 252  318  331        Latest Ref Rng & Units 04/02/2023    8:04 AM 04/01/2023    3:38 AM 03/31/2023   11:54 PM  CMP  Glucose 70 - 99  mg/dL 782  91  94   BUN 6 - 20 mg/dL 6  11  12    Creatinine 0.44 - 1.00 mg/dL 9.56  2.13  0.86   Sodium 135 - 145 mmol/L 137  135  135   Potassium 3.5 - 5.1 mmol/L 3.9  3.4  3.5   Chloride 98 - 111 mmol/L 111  103  101   CO2 22 - 32 mmol/L 22  21  20    Calcium 8.9 - 10.3 mg/dL 8.2  8.8  9.2   Total Protein 6.5 - 8.1 g/dL 6.0   9.0   Total Bilirubin 0.3 - 1.2 mg/dL 0.8   1.6   Alkaline Phos 38 - 126 U/L 63   89   AST 15 - 41 U/L 29   47   ALT 0 - 44 U/L 24   36       Microbiology:  Studies/Results: MR ANKLE RIGHT W WO CONTRAST  Result Date: 04/01/2023 CLINICAL DATA:  Pain and swelling the ankle and foot. EXAM: MRI OF THE RIGHT ANKLE WITHOUT AND WITH CONTRAST TECHNIQUE: Multiplanar, multisequence MR imaging of the ankle was performed before and after the administration of intravenous contrast.  CONTRAST:  6mL GADAVIST GADOBUTROL 1 MMOL/ML IV SOLN COMPARISON:  None Available. FINDINGS: TENDONS Peroneal: Intact.  No tendinopathy or tenosynovitis. Posteromedial: Intact. Mild tenosynovitis involving the posterior tibialis tendon. Anterior: Intact.  No tendinopathy or tenosynovitis. Achilles: Normal Plantar Fascia: Intact.  No findings for plantar fasciitis. LIGAMENTS Lateral: Intact Medial: Intact CARTILAGE Ankle Joint: There is a moderate-sized ankle joint effusion but normal appearing rim like synovial enhancement after contrast administration. The articular cartilage is intact. No findings suspicious for septic arthritis. Subtalar Joints/Sinus Tarsi: The subtalar joints are maintained. Normal sinus tarsi. Bones: Marked edema like signal changes involving the distal tibia along with diffuse enhancement after contrast administration. There is a distal tibial stress fracture mainly running horizontally across the metaphysis but extending down into the medial malleolus. No obvious periosteal reaction. There is surrounding soft tissue swelling/inflammation and enhancement. Other: Diffuse subcutaneous soft  tissue swelling/edema without significant enhancement. No subcutaneous abscess. No findings for myofasciitis or pyomyositis. IMPRESSION: IMPRESSION 1. Distal tibial stress fracture. 2. Moderate-sized ankle joint effusion but no definite MR findings to suggest septic arthritis. There is normal rim like synovial enhancement and normal appearing articular cartilage. There is no enhancement or edema involving the adjacent talus. Recommend correlation with clinical findings. Joint aspiration would be an option if strong clinical suspicion. 3. Diffuse subcutaneous soft tissue swelling/edema but no significant enhancement or subcutaneous abscess. No findings for myofasciitis or pyomyositis. Electronically Signed   By: Rudie Meyer M.D.   On: 04/01/2023 08:46     Assessment/Plan: ***

## 2023-04-02 NOTE — Progress Notes (Signed)
Progress Note    Anna Ferguson  ZOX:[id number] DOB: Jan 28, 1986  DOA: 04/01/2023 PCP: Pcp, No      Brief Narrative:    Medical records reviewed and are as summarized below:  Anna Ferguson is a 37 y.o. female with medical history significant for ongoing tobacco abuse and former IV drug abuse, who presented to the emergency room with acute onset of worsening right ankle pain associated with swelling, redness and warmth.  She had trouble ambulating because of pain in the right foot.     Assessment/Plan:   Principal Problem:   Cellulitis of right ankle Active Problems:   Tobacco abuse    Body mass index is 20.53 kg/m.   Right foot cellulitis, right ankle effusion: Continue IV cefazolin.  Analgesics as needed for pain.  Discontinue IV fluids.  Follow-up with ID and orthopedic surgeon.   Distal right tibial stress fracture: Analgesics as needed for pain.  Follow-up with orthopedic surgeon.   Hypokalemia: Improved   Polysubstance use disorder, tobacco use disorder: Urine toxicology positive for crystal meth, ecstasy and marijuana.    Diet Order             Diet Heart Room service appropriate? Yes; Fluid consistency: Thin  Diet effective now                            Consultants: ID specialist Orthopedic surgeon  Procedures: None    Medications:    acetaminophen  1,000 mg Oral Q6H   enoxaparin (LOVENOX) injection  40 mg Subcutaneous Q24H   Continuous Infusions:  0.9 % NaCl with KCl 20 mEq / L 75 mL/hr at 04/02/23 0633    ceFAZolin (ANCEF) IV 2 g (04/02/23 0758)     Anti-infectives (From admission, onward)    Start     Dose/Rate Route Frequency Ordered Stop   04/02/23 0300  vancomycin (VANCOREADY) IVPB 1750 mg/350 mL  Status:  Discontinued        1,750 mg 175 mL/hr over 120 Minutes Intravenous Every 24 hours 04/01/23 0413 04/01/23 1027   04/02/23 0015  ceFAZolin (ANCEF) IVPB 2g/100 mL premix        2  g 200 mL/hr over 30 Minutes Intravenous Every 8 hours 04/01/23 2356     04/02/23 0000  cefTRIAXone (ROCEPHIN) 1 g in sodium chloride 0.9 % 100 mL IVPB  Status:  Discontinued        1 g 200 mL/hr over 30 Minutes Intravenous Every 24 hours 04/01/23 1038 04/01/23 1529   04/01/23 1530  vancomycin (VANCOREADY) IVPB 750 mg/150 mL  Status:  Discontinued        750 mg 150 mL/hr over 60 Minutes Intravenous Every 12 hours 04/01/23 1027 04/01/23 1529   04/01/23 0400  vancomycin (VANCOCIN) IVPB 1000 mg/200 mL premix  Status:  Discontinued        1,000 mg 200 mL/hr over 60 Minutes Intravenous  Once 04/01/23 0349 04/01/23 0400   04/01/23 0230  cefTRIAXone (ROCEPHIN) 1 g in sodium chloride 0.9 % 100 mL IVPB        1 g 200 mL/hr over 30 Minutes Intravenous  Once 04/01/23 0215 04/01/23 0338   04/01/23 0230  vancomycin (VANCOREADY) IVPB 1500 mg/300 mL        1,500 mg 150 mL/hr over 120 Minutes Intravenous  Once 04/01/23 0226 04/01/23 0455  Family Communication/Anticipated D/C date and plan/Code Status   DVT prophylaxis: enoxaparin (LOVENOX) injection 40 mg Start: 04/01/23 0800     Code Status: Full Code  Family Communication: None Disposition Plan: Plan to discharge home in 2 to 3 days   Status is: Inpatient Remains inpatient appropriate because: Right foot cellulitis on IV antibiotics       Subjective:   Interval events noted.  She complains of pain in the right foot.  Objective:    Vitals:   04/01/23 1243 04/01/23 1555 04/01/23 2336 04/02/23 0755  BP: 99/62 93/61 (!) 108/58 103/62  Pulse: 73 72 71 75  Resp: 14 18 14 15   Temp: 98 F (36.7 C) 98.1 F (36.7 C) 98 F (36.7 C) 97.7 F (36.5 C)  TempSrc: Oral Oral    SpO2: 96% 99% 99% 100%  Weight:      Height:       No data found.   Intake/Output Summary (Last 24 hours) at 04/02/2023 1139 Last data filed at 04/01/2023 1600 Gross per 24 hour  Intake 903.07 ml  Output --  Net 903.07 ml   Filed Weights    03/31/23 2304  Weight: 61.2 kg    Exam:  GEN: NAD SKIN: Warm and dry EYES: No pallor or icterus ENT: MMM CV: RRR PULM: CTA B ABD: soft, ND, NT, +BS CNS: AAO x 3, non focal EXT: Swelling, tenderness and erythema of right foot.  Mild tenderness over left foot without swelling or erythema.        Data Reviewed:   I have personally reviewed following labs and imaging studies:  Labs: Labs show the following:   Basic Metabolic Panel: Recent Labs  Lab 03/31/23 2354 04/01/23 0338 04/02/23 0804  NA 135 135 137  K 3.5 3.4* 3.9  CL 101 103 111  CO2 20* 21* 22  GLUCOSE 94 91 103*  BUN 12 11 6   CREATININE 0.52 0.56 0.48  CALCIUM 9.2 8.8* 8.2*   GFR Estimated Creatinine Clearance: 91.2 mL/min (by C-G formula based on SCr of 0.48 mg/dL). Liver Function Tests: Recent Labs  Lab 03/31/23 2354 04/02/23 0804  AST 47* 29  ALT 36 24  ALKPHOS 89 63  BILITOT 1.6* 0.8  PROT 9.0* 6.0*  ALBUMIN 4.3 2.9*   No results for input(s): "LIPASE", "AMYLASE" in the last 168 hours. No results for input(s): "AMMONIA" in the last 168 hours. Coagulation profile No results for input(s): "INR", "PROTIME" in the last 168 hours.  CBC: Recent Labs  Lab 03/31/23 2354 04/01/23 0338 04/02/23 0804  WBC 9.4 7.2 3.4*  NEUTROABS 7.2  --   --   HGB 14.0 12.5 11.3*  HCT 45.4 39.7 36.1  MCV 86.8 86.3 85.5  PLT 331 318 252   Cardiac Enzymes: No results for input(s): "CKTOTAL", "CKMB", "CKMBINDEX", "TROPONINI" in the last 168 hours. BNP (last 3 results) No results for input(s): "PROBNP" in the last 8760 hours. CBG: No results for input(s): "GLUCAP" in the last 168 hours. D-Dimer: No results for input(s): "DDIMER" in the last 72 hours. Hgb A1c: No results for input(s): "HGBA1C" in the last 72 hours. Lipid Profile: No results for input(s): "CHOL", "HDL", "LDLCALC", "TRIG", "CHOLHDL", "LDLDIRECT" in the last 72 hours. Thyroid function studies: No results for input(s): "TSH", "T4TOTAL",  "T3FREE", "THYROIDAB" in the last 72 hours.  Invalid input(s): "FREET3" Anemia work up: No results for input(s): "VITAMINB12", "FOLATE", "FERRITIN", "TIBC", "IRON", "RETICCTPCT" in the last 72 hours. Sepsis Labs: Recent Labs  Lab 03/31/23 2354  04/01/23 0226 04/01/23 0338 04/01/23 1800 04/02/23 0804  WBC 9.4  --  7.2  --  3.4*  LATICACIDVEN  --  1.0  --  0.8  --     Microbiology Recent Results (from the past 240 hour(s))  Chlamydia/NGC rt PCR ([facility] only)     Status: None   Collection Time: 04/01/23  8:00 AM   Specimen: Urine, Clean Catch  Result Value Ref Range Status   Specimen source GC/Chlam URINE, RANDOM  Final   Chlamydia Tr NOT DETECTED NOT DETECTED Final   N gonorrhoeae NOT DETECTED NOT DETECTED Final    Comment: (NOTE) This CT/NG assay has not been evaluated in patients with a history of  hysterectomy. Performed at [facility], [address] [place], [place] [address]     Procedures and diagnostic studies:  MR ANKLE RIGHT W WO CONTRAST  Result Date: 04/01/2023 CLINICAL DATA:  Pain and swelling the ankle and foot. EXAM: MRI OF THE RIGHT ANKLE WITHOUT AND WITH CONTRAST TECHNIQUE: Multiplanar, multisequence MR imaging of the ankle was performed before and after the administration of intravenous contrast. CONTRAST:  6mL GADAVIST GADOBUTROL 1 MMOL/ML IV SOLN COMPARISON:  None Available. FINDINGS: TENDONS Peroneal: Intact.  No tendinopathy or tenosynovitis. Posteromedial: Intact. Mild tenosynovitis involving the posterior tibialis tendon. Anterior: Intact.  No tendinopathy or tenosynovitis. Achilles: Normal Plantar Fascia: Intact.  No findings for plantar fasciitis. LIGAMENTS Lateral: Intact Medial: Intact CARTILAGE Ankle Joint: There is a moderate-sized ankle joint effusion but normal appearing rim like synovial enhancement after contrast administration. The articular cartilage is intact. No findings suspicious for septic arthritis. Subtalar Joints/Sinus Tarsi: The  subtalar joints are maintained. Normal sinus tarsi. Bones: Marked edema like signal changes involving the distal tibia along with diffuse enhancement after contrast administration. There is a distal tibial stress fracture mainly running horizontally across the metaphysis but extending down into the medial malleolus. No obvious periosteal reaction. There is surrounding soft tissue swelling/inflammation and enhancement. Other: Diffuse subcutaneous soft tissue swelling/edema without significant enhancement. No subcutaneous abscess. No findings for myofasciitis or pyomyositis. IMPRESSION: IMPRESSION 1. Distal tibial stress fracture. 2. Moderate-sized ankle joint effusion but no definite MR findings to suggest septic arthritis. There is normal rim like synovial enhancement and normal appearing articular cartilage. There is no enhancement or edema involving the adjacent talus. Recommend correlation with clinical findings. Joint aspiration would be an option if strong clinical suspicion. 3. Diffuse subcutaneous soft tissue swelling/edema but no significant enhancement or subcutaneous abscess. No findings for myofasciitis or pyomyositis. Electronically Signed   By: Rudie Meyer M.D.   On: 04/01/2023 08:46               LOS: 1 day   Anna Ferguson  [facility]   Pager on [email or web address]. If 7PM-7AM, please contact night-coverage at [email or web address]     04/02/2023, 11:39 AM

## 2023-04-02 NOTE — Plan of Care (Signed)

## 2023-04-02 NOTE — TOC Initial Note (Signed)
Transition of Care [facility]) - Initial/Assessment Note    Patient Details  Name: Anna Ferguson MRN: [id number] Date of Birth: Sep 23, 1986  Transition of Care [facility]) CM/SW Contact:    Marlowe Sax, RN Phone Number: 04/02/2023, 9:43 AM  Clinical Narrative:     Spoke with the patient She reports that she lives at home with her dad,  She uses Public transportation such as the Bus She has medicaid but not a PCP, she is planning to call medicaid about getting set up with a PCP [facility] She stated that she has no needs that she can think of.     Transition of Care [facility]) Screening Note   Patient Details  Name: Anna Ferguson Date of Birth: [birth year]   Transition of Care [facility]) CM/SW Contact:    Marlowe Sax, RN Phone Number: 04/02/2023, 9:44 AM    Transition of Care Department [facility]) has reviewed patient and no TOC needs have been identified at this time. We will continue to monitor patient advancement through interdisciplinary progression rounds. If new patient transition needs arise, please place a TOC consult.                Expected Discharge Plan: Home/Self Care Barriers to Discharge: No Barriers Identified   Patient Goals and CMS Choice            Expected Discharge Plan and Services   Discharge Planning Services: CM Consult   Living arrangements for the past 2 months: Single Family Home                 DME Arranged: N/A DME Agency: NA       HH Arranged: NA HH Agency: NA        Prior Living Arrangements/Services Living arrangements for the past 2 months: Single Family Home Lives with:: Parents (Lives with Father) Patient language and need for interpreter reviewed:: Yes Do you feel safe going back to the place where you live?: Yes      Need for Family Participation in Patient Care: No (Comment) Care giver support system in place?: Yes (comment)   Criminal Activity/Legal Involvement Pertinent to  Current Situation/Hospitalization: No - Comment as needed  Activities of Daily Living Home Assistive Devices/Equipment: Eyeglasses ADL Screening (condition at time of admission) Patient's cognitive ability adequate to safely complete daily activities?: Yes Is the patient deaf or have difficulty hearing?: No Does the patient have difficulty seeing, even when wearing glasses/contacts?: No Does the patient have difficulty concentrating, remembering, or making decisions?: No Patient able to express need for assistance with ADLs?: Yes Does the patient have difficulty dressing or bathing?: No Independently performs ADLs?: Yes (appropriate for developmental age) Does the patient have difficulty walking or climbing stairs?: No Weakness of Legs: Right Weakness of Arms/Hands: None  Permission Sought/Granted   Permission granted to share information with : Yes, Verbal Permission Granted              Emotional Assessment Appearance:: Appears stated age Attitude/Demeanor/Rapport: Engaged Affect (typically observed): Pleasant Orientation: : Oriented to Self, Oriented to Place, Oriented to  Time, Oriented to Situation Alcohol / Substance Use: Not Applicable Psych Involvement: No (comment)  Admission diagnosis:  Right ankle swelling [M25.471] Cellulitis of right lower extremity [L03.115] Cellulitis of right ankle [L03.115] Patient Active Problem List   Diagnosis Date Noted   Cellulitis of right ankle 04/01/2023   Tobacco abuse 04/01/2023   Acute blood loss anemia 08/12/2018  Acute post-hemorrhagic anemia 08/10/2018   PCP:  Pcp, No Pharmacy:   [id number] [place], [place] - [address] [place] [address] Phone: [phone] Fax: [phone]     Social Determinants of Health (SDOH) Social History: SDOH Screenings   Food Insecurity: No Food Insecurity (04/01/2023)  Housing: Low Risk  (04/01/2023)  Transportation Needs: No Transportation Needs (04/01/2023)   Utilities: Not At Risk (04/01/2023)  Tobacco Use: High Risk (04/02/2023)   SDOH Interventions:     Readmission Risk Interventions     No data to display

## 2023-04-03 DIAGNOSIS — E876 Hypokalemia: Secondary | ICD-10-CM | POA: Insufficient documentation

## 2023-04-03 DIAGNOSIS — L03115 Cellulitis of right lower limb: Secondary | ICD-10-CM | POA: Diagnosis not present

## 2023-04-03 LAB — CBC WITH DIFFERENTIAL/PLATELET
Abs Immature Granulocytes: 0.02 10*3/uL (ref 0.00–0.07)
Basophils Absolute: 0 10*3/uL (ref 0.0–0.1)
Basophils Relative: 1 %
Eosinophils Absolute: 0.2 10*3/uL (ref 0.0–0.5)
Eosinophils Relative: 5 %
HCT: 37.9 % (ref 36.0–46.0)
Hemoglobin: 11.8 g/dL — ABNORMAL LOW (ref 12.0–15.0)
Immature Granulocytes: 1 %
Lymphocytes Relative: 41 %
Lymphs Abs: 1.7 10*3/uL (ref 0.7–4.0)
MCH: 26.6 pg (ref 26.0–34.0)
MCHC: 31.1 g/dL (ref 30.0–36.0)
MCV: 85.4 fL (ref 80.0–100.0)
Monocytes Absolute: 0.4 10*3/uL (ref 0.1–1.0)
Monocytes Relative: 9 %
Neutro Abs: 1.8 10*3/uL (ref 1.7–7.7)
Neutrophils Relative %: 43 %
Platelets: 281 10*3/uL (ref 150–400)
RBC: 4.44 MIL/uL (ref 3.87–5.11)
RDW: 13.7 % (ref 11.5–15.5)
WBC: 4 10*3/uL (ref 4.0–10.5)
nRBC: 0 % (ref 0.0–0.2)

## 2023-04-03 LAB — RPR: RPR Ser Ql: NONREACTIVE

## 2023-04-03 NOTE — Plan of Care (Signed)
  Problem: Education: Goal: Knowledge of General Education information will improve Description: Including pain rating scale, medication(s)/side effects and non-pharmacologic comfort measures Outcome: Progressing   Problem: Nutrition: Goal: Adequate nutrition will be maintained Outcome: Progressing   Problem: Coping: Goal: Level of anxiety will decrease Outcome: Progressing   Problem: Elimination: Goal: Will not experience complications related to bowel motility Outcome: Progressing Goal: Will not experience complications related to urinary retention Outcome: Progressing   Problem: Activity: Goal: Risk for activity intolerance will decrease Outcome: Progressing   Problem: Pain Managment: Goal: General experience of comfort will improve Outcome: Progressing

## 2023-04-03 NOTE — Progress Notes (Addendum)
Progress Note    Anna Ferguson  WJX:[id number] DOB: [birth year]  DOA: 04/01/2023 PCP: Pcp, No      Brief Narrative:    Medical records reviewed and are as summarized below:  Anna Ferguson is a 37 y.o. female with medical history significant for ongoing tobacco abuse and former IV drug abuse, who presented to the emergency room with acute onset of worsening right ankle pain associated with swelling, redness and warmth.  Anna Ferguson had trouble ambulating because of pain in the right foot.     Assessment/Plan:   Principal Problem:   Cellulitis of right lower extremity Active Problems:   Tobacco abuse   Hypokalemia    Body mass index is 20.53 kg/m.   Right foot cellulitis, right ankle effusion: Slowly improving.  Continue IV cefazolin.  Analgesics as needed for pain.  Dr. Rica Mast, orthopedic surgeon, has signed off the case because septic arthritis is not suspected at this time.  Follow-up with ID for further recommendations.     Distal right tibial stress fracture: Analgesics as needed for pain.    Hypokalemia: Improved   Polysubstance use disorder, tobacco use disorder: Urine toxicology positive for crystal meth, ecstasy and marijuana.    Diet Order             Diet Heart Room service appropriate? Yes; Fluid consistency: Thin  Diet effective now                            Consultants: ID specialist Orthopedic surgeon  Procedures: None    Medications:    acetaminophen  1,000 mg Oral Q6H   enoxaparin (LOVENOX) injection  40 mg Subcutaneous Q24H   Continuous Infusions:   ceFAZolin (ANCEF) IV 2 g (04/03/23 0819)     Anti-infectives (From admission, onward)    Start     Dose/Rate Route Frequency Ordered Stop   04/02/23 0300  vancomycin (VANCOREADY) IVPB 1750 mg/350 mL  Status:  Discontinued        1,750 mg 175 mL/hr over 120 Minutes Intravenous Every 24 hours 04/01/23 0413 04/01/23 1027   04/02/23 0015   ceFAZolin (ANCEF) IVPB 2g/100 mL premix        2 g 200 mL/hr over 30 Minutes Intravenous Every 8 hours 04/01/23 2356     04/02/23 0000  cefTRIAXone (ROCEPHIN) 1 g in sodium chloride 0.9 % 100 mL IVPB  Status:  Discontinued        1 g 200 mL/hr over 30 Minutes Intravenous Every 24 hours 04/01/23 1038 04/01/23 1529   04/01/23 1530  vancomycin (VANCOREADY) IVPB 750 mg/150 mL  Status:  Discontinued        750 mg 150 mL/hr over 60 Minutes Intravenous Every 12 hours 04/01/23 1027 04/01/23 1529   04/01/23 0400  vancomycin (VANCOCIN) IVPB 1000 mg/200 mL premix  Status:  Discontinued        1,000 mg 200 mL/hr over 60 Minutes Intravenous  Once 04/01/23 0349 04/01/23 0400   04/01/23 0230  cefTRIAXone (ROCEPHIN) 1 g in sodium chloride 0.9 % 100 mL IVPB        1 g 200 mL/hr over 30 Minutes Intravenous  Once 04/01/23 0215 04/01/23 0338   04/01/23 0230  vancomycin (VANCOREADY) IVPB 1500 mg/300 mL        1,500 mg 150 mL/hr over 120 Minutes Intravenous  Once 04/01/23 0226 04/01/23 0455  Family Communication/Anticipated D/C date and plan/Code Status   DVT prophylaxis: enoxaparin (LOVENOX) injection 40 mg Start: 04/01/23 0800     Code Status: Full Code  Family Communication: None Disposition Plan: Plan to discharge home in 1 to 2 days   Status is: Inpatient Remains inpatient appropriate because: Right foot cellulitis on IV antibiotics       Subjective:   Interval events noted.  Pain in the right foot is improving.  Anna Ferguson thinks swelling in the right foot is also getting better.  No other complaints.  Objective:    Vitals:   04/02/23 0755 04/02/23 1601 04/02/23 2150 04/03/23 0803  BP: 103/62 (!) 91/54 100/65 117/77  Pulse: 75 71 72 66  Resp: 15 16 18 17   Temp: 97.7 F (36.5 C) 98.2 F (36.8 C) 98.1 F (36.7 C) 97.8 F (36.6 C)  TempSrc:      SpO2: 100% 100% 99% 100%  Weight:      Height:       No data found.   Intake/Output Summary (Last 24 hours) at  04/03/2023 1418 Last data filed at 04/03/2023 0300 Gross per 24 hour  Intake 520 ml  Output --  Net 520 ml   Filed Weights   03/31/23 2304  Weight: 61.2 kg    Exam:  GEN: NAD SKIN: No rash EYES: EOMI ENT: MMM CV: RRR PULM: CTA B ABD: soft, ND, NT, +BS CNS: AAO x 3, non focal EXT: Swelling, tenderness and erythema of right foot are slowly improving.      Data Reviewed:   I have personally reviewed following labs and imaging studies:  Labs: Labs show the following:   Basic Metabolic Panel: Recent Labs  Lab 03/31/23 2354 04/01/23 0338 04/02/23 0804  NA 135 135 137  K 3.5 3.4* 3.9  CL 101 103 111  CO2 20* 21* 22  GLUCOSE 94 91 103*  BUN 12 11 6   CREATININE 0.52 0.56 0.48  CALCIUM 9.2 8.8* 8.2*   GFR Estimated Creatinine Clearance: 91.2 mL/min (by C-G formula based on SCr of 0.48 mg/dL). Liver Function Tests: Recent Labs  Lab 03/31/23 2354 04/02/23 0804  AST 47* 29  ALT 36 24  ALKPHOS 89 63  BILITOT 1.6* 0.8  PROT 9.0* 6.0*  ALBUMIN 4.3 2.9*   No results for input(s): "LIPASE", "AMYLASE" in the last 168 hours. No results for input(s): "AMMONIA" in the last 168 hours. Coagulation profile No results for input(s): "INR", "PROTIME" in the last 168 hours.  CBC: Recent Labs  Lab 03/31/23 2354 04/01/23 0338 04/02/23 0804 04/03/23 0505  WBC 9.4 7.2 3.4* 4.0  NEUTROABS 7.2  --   --  1.8  HGB 14.0 12.5 11.3* 11.8*  HCT 45.4 39.7 36.1 37.9  MCV 86.8 86.3 85.5 85.4  PLT 331 318 252 281   Cardiac Enzymes: No results for input(s): "CKTOTAL", "CKMB", "CKMBINDEX", "TROPONINI" in the last 168 hours. BNP (last 3 results) No results for input(s): "PROBNP" in the last 8760 hours. CBG: No results for input(s): "GLUCAP" in the last 168 hours. D-Dimer: No results for input(s): "DDIMER" in the last 72 hours. Hgb A1c: No results for input(s): "HGBA1C" in the last 72 hours. Lipid Profile: No results for input(s): "CHOL", "HDL", "LDLCALC", "TRIG", "CHOLHDL",  "LDLDIRECT" in the last 72 hours. Thyroid function studies: No results for input(s): "TSH", "T4TOTAL", "T3FREE", "THYROIDAB" in the last 72 hours.  Invalid input(s): "FREET3" Anemia work up: No results for input(s): "VITAMINB12", "FOLATE", "FERRITIN", "TIBC", "IRON", "RETICCTPCT" in the last  72 hours. Sepsis Labs: Recent Labs  Lab 03/31/23 2354 04/01/23 0226 04/01/23 0338 04/01/23 1800 04/02/23 0804 04/03/23 0505  WBC 9.4  --  7.2  --  3.4* 4.0  LATICACIDVEN  --  1.0  --  0.8  --   --     Microbiology Recent Results (from the past 240 hour(s))  Chlamydia/NGC rt PCR ([facility] only)     Status: None   Collection Time: 04/01/23  8:00 AM   Specimen: Urine, Clean Catch  Result Value Ref Range Status   Specimen source GC/Chlam URINE, RANDOM  Final   Chlamydia Tr NOT DETECTED NOT DETECTED Final   N gonorrhoeae NOT DETECTED NOT DETECTED Final    Comment: (NOTE) This CT/NG assay has not been evaluated in patients with a history of  hysterectomy. Performed at [facility], [address] [place], [place] [address]   Chlamydia/NGC rt PCR [facility] only)     Status: None   Collection Time: 04/02/23  7:02 PM   Specimen: Throat  Result Value Ref Range Status   Specimen source GC/Chlam URINE, RANDOM  Final   Chlamydia Tr NOT DETECTED NOT DETECTED Final   N gonorrhoeae NOT DETECTED NOT DETECTED Final    Comment: (NOTE) This CT/NG assay has not been evaluated in patients with a history of  hysterectomy. Performed at [facility], [address] [place], [place] [address]     Procedures and diagnostic studies:  No results found.             LOS: 2 days   Mirage Pfefferkorn  [facility]   Pager on [email or web address]. If 7PM-7AM, please contact night-coverage at [email or web address]     04/03/2023, 2:18 PM

## 2023-04-03 NOTE — Progress Notes (Signed)
PIV removed, pt signed AMA form, advised and educated about how AMA works and why she needs to stay, pt was persistent and still wanted to leave AMA regardless of her condition.

## 2023-04-03 NOTE — Progress Notes (Signed)
Patient ID: Anna Ferguson, female   DOB: Apr 22, 1986, 37 y.o.   MRN: [id number]  Subjective: The patient notes some improvement in her right foot and ankle pain since admission, although she still has discomfort, especially with weightbearing.  She denies any fevers or chills, and denies any numbness or paresthesias to her foot.  She has been able to ambulate with a walker, weightbearing as tolerated on the right leg.  She has no new complaints regarding her right foot or ankle.   Objective: Vital signs in last 24 hours: Temp:  [97.8 F (36.6 C)-98.2 F (36.8 C)] 97.8 F (36.6 C) (05/09 0803) Pulse Rate:  [66-72] 66 (05/09 0803) Resp:  [16-18] 17 (05/09 0803) BP: (91-117)/(54-77) 117/77 (05/09 0803) SpO2:  [99 %-100 %] 100 % (05/09 0803)  Intake/Output from previous day: 05/08 0701 - 05/09 0700 In: 520 [P.O.:120; IV Piggyback:400] Out: -  Intake/Output this shift: No intake/output data recorded.  Recent Labs    03/31/23 2354 04/01/23 0338 04/02/23 0804 04/03/23 0505  HGB 14.0 12.5 11.3* 11.8*   Recent Labs    04/02/23 0804 04/03/23 0505  WBC 3.4* 4.0  RBC 4.22 4.44  HCT 36.1 37.9  PLT 252 281   Recent Labs    04/01/23 0338 04/02/23 0804  NA 135 137  K 3.4* 3.9  CL 103 111  CO2 21* 22  BUN 11 6  CREATININE 0.56 0.48  GLUCOSE 91 103*  CALCIUM 8.8* 8.2*   No results for input(s): "LABPT", "INR" in the last 72 hours.  Physical Exam: Orthopedic examination again is limited to the right foot and lower leg.  The swelling and erythema noted on admission is much improved, although she still has some swelling and erythema over the dorsal aspect of her foot.  She has mild-moderate tenderness to palpation over the dorsal lateral aspect of her foot in the area of the swelling and erythema, but has only minimal tenderness over the ankle region.  There does not appear to be any obvious ankle effusion on today's exam.  She is able to dorsiflex and plantarflex her  toes and ankle with only minimal discomfort.  She is grossly neurovascularly intact to her right foot.  Assessment: Right foot/ankle cellulitis, improving symptomatically.  Plan: The treatment options have been reviewed with the patient.  Given her marked improvement clinically, I do not feel that she has a septic joint and therefore will not need any surgical intervention.  I would continue the IV antibiotics for 1-2 more days or as recommended by infectious disease before discharging her home on oral antibiotics.  She may continue to be mobilized with physical therapy, weightbearing as tolerated on the right foot.  Thank you for asking me to participate in the care of this most pleasant yet unfortunate woman.  I will sign off at this time.  She may follow-up in our office on an as necessary basis.  If you have further need of orthopedic input during this hospitalization, please reconsult me.   Excell Seltzer [place] 04/03/2023, 10:15 AM

## 2023-04-03 NOTE — Progress Notes (Signed)
Patient stated if she was not able to go outside, she would leave AMA. MD was contacted and stated she did not give her permission to go outside. Patient was explained the benefits of staying in the hospital to receive anabiotic treatment so the infection could continue to improve. This nurse also explained the dangers of her leaving AMA that included her condition getting worst and how she could become septic which could lead to a long recovery or even death. Patient stated she understood but choose to leave. IV was removed by nurse. Patient signed AMA form and was noted to be dressed, carrying her bag as she walked off the unit.

## 2023-04-03 NOTE — Progress Notes (Signed)
   Date of Admission:  04/01/2023     ID: Anna Ferguson is a 37 y.o. female  Principal Problem:   Cellulitis of right lower extremity Active Problems:   Tobacco abuse   Hypokalemia    Subjective: Rt foot pain and swelling bettter  Medications:   acetaminophen  1,000 mg Oral Q6H   enoxaparin (LOVENOX) injection  40 mg Subcutaneous Q24H    Objective: Vital signs in last 24 hours: Patient Vitals for the past 24 hrs:  BP Temp Pulse Resp SpO2  04/03/23 0803 117/77 97.8 F (36.6 C) 66 17 100 %  04/02/23 2150 100/65 98.1 F (36.7 C) 72 18 99 %  04/02/23 1601 (!) 91/54 98.2 F (36.8 C) 71 16 100 %       PHYSICAL EXAM:  General: Alert, cooperative, no distress, appears stated age.  Lungs: Clear to auscultation bilaterally. No Wheezing or Rhonchi. No rales. Heart: Regular rate and rhythm, no murmur, rub or gallop. Abdomen: Soft, non-tender,not distended. Bowel sounds normal. No masses  5/9 Rt foot swelling improved Erythema less Movt at ankle/foot  is better Extremities:  04/03/23   04/02/23   Skin: No rashes or lesions. Or bruising Lymph: Cervical, supraclavicular normal. Neurologic: Grossly non-focal  Lab Results    Latest Ref Rng & Units 04/03/2023    5:05 AM 04/02/2023    8:04 AM 04/01/2023    3:38 AM  CBC  WBC 4.0 - 10.5 K/uL 4.0  3.4  7.2   Hemoglobin 12.0 - 15.0 g/dL 40.9  81.1  91.4   Hematocrit 36.0 - 46.0 % 37.9  36.1  39.7   Platelets 150 - 400 K/uL 281  252  318        Latest Ref Rng & Units 04/02/2023    8:04 AM 04/01/2023    3:38 AM 03/31/2023   11:54 PM  CMP  Glucose 70 - 99 mg/dL 782  91  94   BUN 6 - 20 mg/dL 6  11  12    Creatinine 0.44 - 1.00 mg/dL 9.56  2.13  0.86   Sodium 135 - 145 mmol/L 137  135  135   Potassium 3.5 - 5.1 mmol/L 3.9  3.4  3.5   Chloride 98 - 111 mmol/L 111  103  101   CO2 22 - 32 mmol/L 22  21  20    Calcium 8.9 - 10.3 mg/dL 8.2  8.8  9.2   Total Protein 6.5 - 8.1 g/dL 6.0   9.0   Total Bilirubin 0.3 - 1.2 mg/dL  0.8   1.6   Alkaline Phos 38 - 126 U/L 63   89   AST 15 - 41 U/L 29   47   ALT 0 - 44 U/L 24   36       Microbiology: Urine GC neg  Studies/Results: No results found.   Assessment/Plan: Rt foot cellulitis , improving On cefazolin IV May be able to switch to oral antibiotics in 24hrs Will need antibiotics for minimum 7 days Keep leg elevated on 2-3 pillows Less concern for GC  Stress fracture distal tibia  Polysubstance use Discussed the management with patient and hospitalist

## 2023-04-03 NOTE — Progress Notes (Signed)
Patient left the floor at 1557

## 2023-04-04 NOTE — Discharge Summary (Signed)
Patient left the hospital against medical advice.  She understands the risks of leaving against medical advice including but not limited to worsening  lower extremity cellulitis, sepsis and death.
